# Patient Record
Sex: Female | Born: 1960 | Race: Black or African American | Hispanic: No | Marital: Single | Smoking: Former smoker
Health system: Southern US, Community
[De-identification: ages and names within clinical notes are randomized; demographics above are authoritative.]

## PROBLEM LIST (undated history)

## (undated) DIAGNOSIS — I10 Essential (primary) hypertension: Secondary | ICD-10-CM

## (undated) DIAGNOSIS — F17210 Nicotine dependence, cigarettes, uncomplicated: Secondary | ICD-10-CM

## (undated) HISTORY — DX: Nicotine dependence, cigarettes, uncomplicated: F17.210

## (undated) HISTORY — DX: Essential (primary) hypertension: I10

---

## 1997-07-13 ENCOUNTER — Other Ambulatory Visit: Admission: RE | Admit: 1997-07-13 | Discharge: 1997-07-13 | Payer: Self-pay | Admitting: Family Medicine

## 2001-10-07 ENCOUNTER — Other Ambulatory Visit: Admission: RE | Admit: 2001-10-07 | Discharge: 2001-10-07 | Payer: Self-pay | Admitting: Family Medicine

## 2009-11-17 ENCOUNTER — Emergency Department (HOSPITAL_COMMUNITY): Admission: EM | Admit: 2009-11-17 | Discharge: 2009-11-17 | Payer: Self-pay | Admitting: Diagnostic Radiology

## 2010-04-19 LAB — CBC
HCT: 35.9 % — ABNORMAL LOW (ref 36.0–46.0)
Hemoglobin: 12.2 g/dL (ref 12.0–15.0)
MCH: 26.9 pg (ref 26.0–34.0)
MCHC: 34 g/dL (ref 30.0–36.0)
MCV: 79.2 fL (ref 78.0–100.0)
Platelets: 327 10*3/uL (ref 150–400)
RBC: 4.53 MIL/uL (ref 3.87–5.11)
RDW: 13.8 % (ref 11.5–15.5)
WBC: 7 10*3/uL (ref 4.0–10.5)

## 2010-04-19 LAB — DIFFERENTIAL
Basophils Absolute: 0 10*3/uL (ref 0.0–0.1)
Basophils Relative: 0 % (ref 0–1)
Eosinophils Absolute: 0.2 10*3/uL (ref 0.0–0.7)
Eosinophils Relative: 2 % (ref 0–5)
Lymphocytes Relative: 38 % (ref 12–46)
Lymphs Abs: 2.6 10*3/uL (ref 0.7–4.0)
Monocytes Absolute: 0.9 10*3/uL (ref 0.1–1.0)
Monocytes Relative: 13 % — ABNORMAL HIGH (ref 3–12)
Neutro Abs: 3.3 10*3/uL (ref 1.7–7.7)
Neutrophils Relative %: 47 % (ref 43–77)

## 2010-04-19 LAB — POCT CARDIAC MARKERS
CKMB, poc: <1 ng/mL — ABNORMAL LOW (ref 1.0–8.0)
Myoglobin, poc: 25.2 ng/mL (ref 12–200)
Troponin i, poc: <0.05 ng/mL (ref 0.00–0.09)

## 2010-04-19 LAB — TSH: TSH: 3.27 u[IU]/mL (ref 0.350–4.500)

## 2010-04-19 LAB — POCT I-STAT, CHEM 8
BUN: 15 mg/dL (ref 6–23)
Calcium, Ion: 1.06 mmol/L — ABNORMAL LOW (ref 1.12–1.32)
Chloride: 98 mEq/L (ref 96–112)
Creatinine, Ser: 1.3 mg/dL — ABNORMAL HIGH (ref 0.4–1.2)
Glucose, Bld: 128 mg/dL — ABNORMAL HIGH (ref 70–99)
HCT: 39 % (ref 36.0–46.0)
Hemoglobin: 13.3 g/dL (ref 12.0–15.0)
Potassium: 2.6 mEq/L — CL (ref 3.5–5.1)
Sodium: 138 mEq/L (ref 135–145)
TCO2: 27 mmol/L (ref 0–100)

## 2011-09-28 IMAGING — CR DG CHEST 2V
2 series · 2 of 2 positions shown · non-contrast
Comparison: None.

CLINICAL DATA: Tachycardia.  Palpitations.

CHEST - 2 VIEW

[w chest pa]
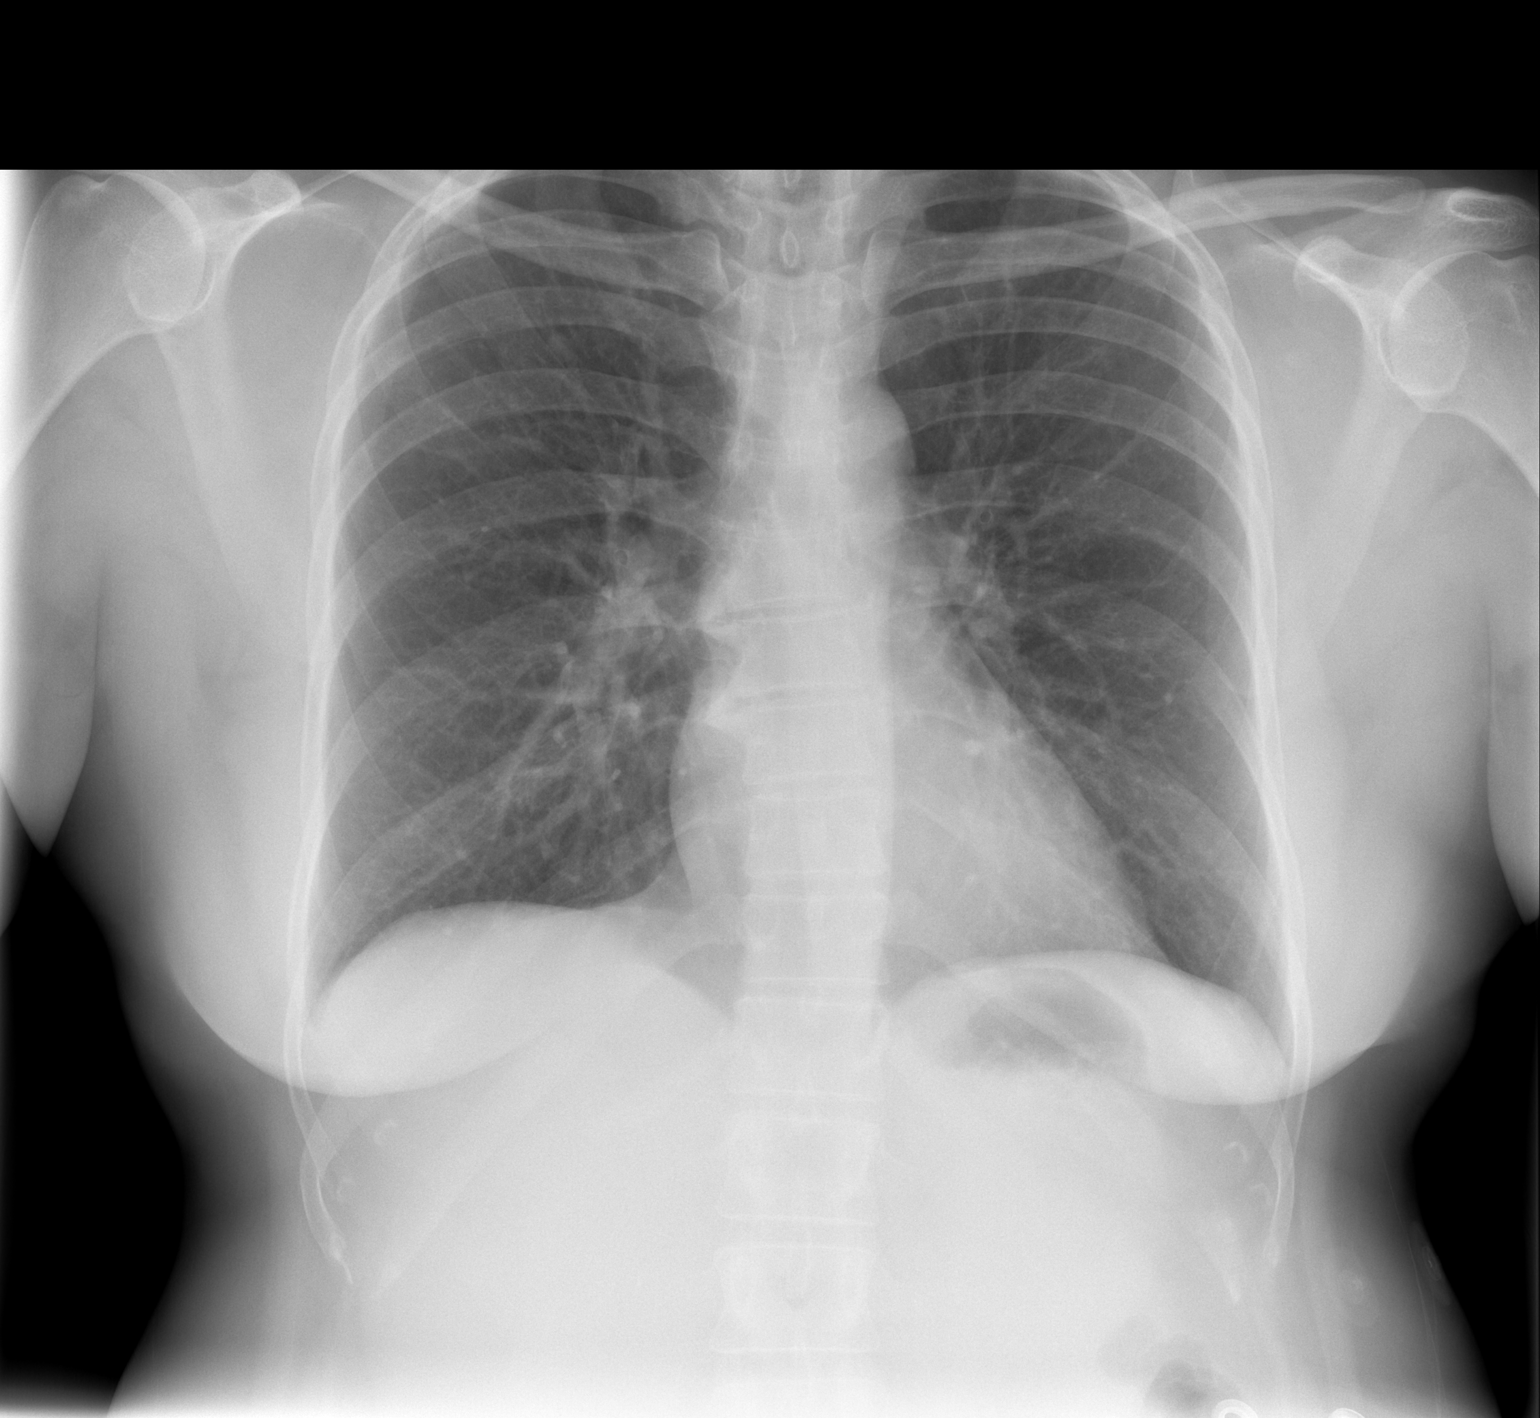

[w chest lat]
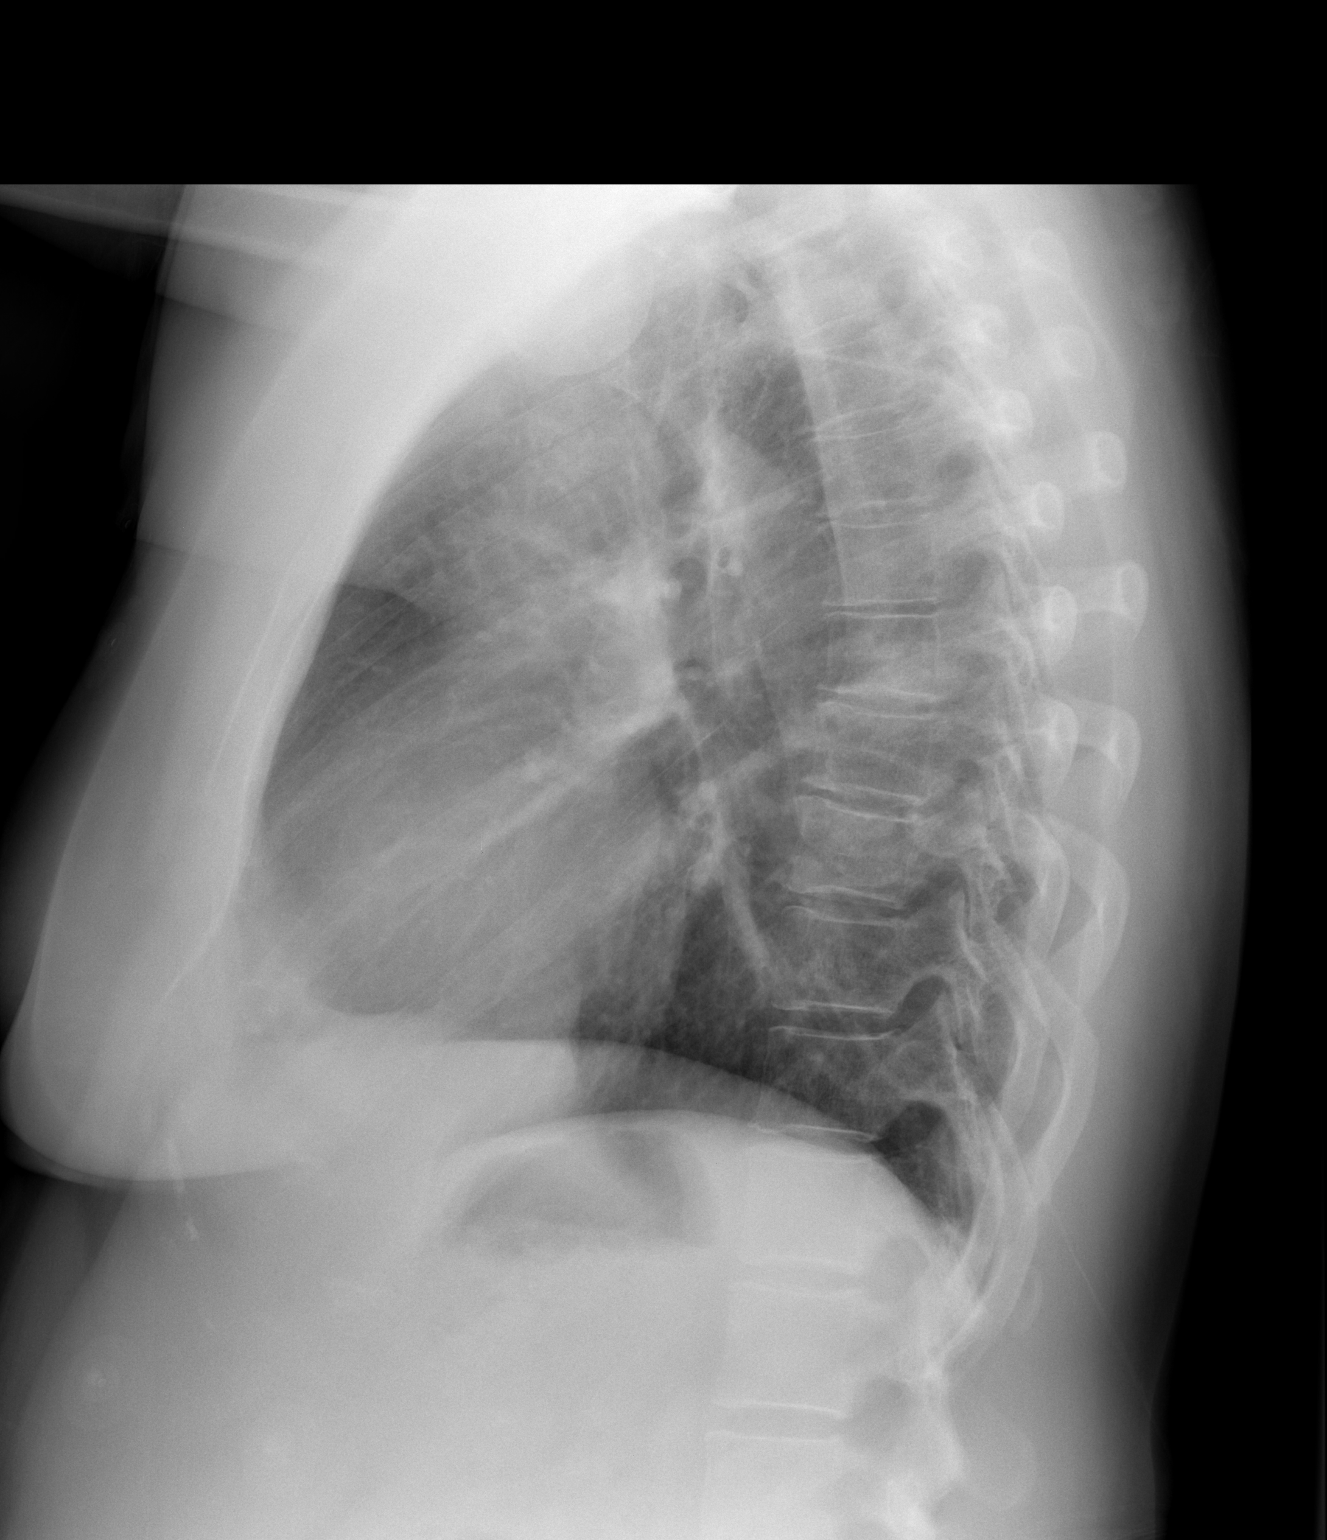

[2 of 2 positions shown; findings below may reference images not displayed]

FINDINGS: The heart size is normal.  The lungs are clear.  The the
visualized soft tissues and bony thorax are unremarkable.
IMPRESSION: Negative chest.

## 2017-05-05 ENCOUNTER — Ambulatory Visit (INDEPENDENT_AMBULATORY_CARE_PROVIDER_SITE_OTHER): Payer: Self-pay | Admitting: Family Medicine

## 2017-05-05 ENCOUNTER — Encounter: Payer: Self-pay | Admitting: Family Medicine

## 2017-05-05 VITALS — BP 144/70 | HR 76 | Temp 97.9°F | Resp 16 | Ht 64.0 in | Wt 188.0 lb

## 2017-05-05 DIAGNOSIS — Z131 Encounter for screening for diabetes mellitus: Secondary | ICD-10-CM

## 2017-05-05 DIAGNOSIS — E785 Hyperlipidemia, unspecified: Secondary | ICD-10-CM

## 2017-05-05 DIAGNOSIS — H6123 Impacted cerumen, bilateral: Secondary | ICD-10-CM

## 2017-05-05 DIAGNOSIS — I10 Essential (primary) hypertension: Secondary | ICD-10-CM

## 2017-05-05 DIAGNOSIS — Z23 Encounter for immunization: Secondary | ICD-10-CM

## 2017-05-05 DIAGNOSIS — Z9109 Other allergy status, other than to drugs and biological substances: Secondary | ICD-10-CM

## 2017-05-05 DIAGNOSIS — Z114 Encounter for screening for human immunodeficiency virus [HIV]: Secondary | ICD-10-CM

## 2017-05-05 DIAGNOSIS — Z1159 Encounter for screening for other viral diseases: Secondary | ICD-10-CM | POA: Insufficient documentation

## 2017-05-05 LAB — POCT URINALYSIS DIP (DEVICE)
Bilirubin Urine: NEGATIVE
Glucose, UA: NEGATIVE mg/dL
Hgb urine dipstick: NEGATIVE
Ketones, ur: NEGATIVE mg/dL
Leukocytes, UA: NEGATIVE
Nitrite: NEGATIVE
Protein, ur: NEGATIVE mg/dL
Specific Gravity, Urine: 1.02 (ref 1.005–1.030)
Urobilinogen, UA: 0.2 mg/dL (ref 0.0–1.0)
pH: 6 (ref 5.0–8.0)

## 2017-05-05 LAB — POCT GLYCOSYLATED HEMOGLOBIN (HGB A1C): Hemoglobin A1C: 5.5

## 2017-05-05 MED ORDER — HYDROCHLOROTHIAZIDE 25 MG PO TABS: 25.0000 mg | ORAL_TABLET | Freq: Every day | ORAL | 5 refills | Status: DC

## 2017-05-05 NOTE — Patient Instructions (Addendum)
Brenda Francis, BCCCP, for mammogram scheduling. [phone]   Brenda Francis [phone]) to schedule appointment with financial counselor for assistance.    Your blood pressure is above goal, will continue Hydrochlorothiazide 25 mg daily for high blood pressure.  We have discussed target BP range and blood pressure goal. I have advised patient to check BP regularly and to call us back or report to clinic if your blood pressure is  consistently higher than 140/90. We discussed the importance of compliance with medical therapy and DASH diet recommended, consequences of uncontrolled hypertension discussed.  continue current BP medication   Will follow up by phone with any abnormal laboratory results   DASH Eating Plan DASH stands for "Dietary Approaches to Stop Hypertension." The DASH eating plan is a healthy eating plan that has been shown to reduce high blood pressure (hypertension). It may also reduce your risk for type 2 diabetes, heart disease, and stroke. The DASH eating plan may also help with weight loss. What are tips for following this plan? General guidelines  Avoid eating more than 2,300 mg (milligrams) of salt (sodium) a day. If you have hypertension, you may need to reduce your sodium intake to 1,500 mg a day.  Limit alcohol intake to no more than 1 drink a day for nonpregnant women and 2 drinks a day for men. One drink equals 12 oz of beer, 5 oz of wine, or 1 oz of hard liquor.  Work with your health care provider to maintain a healthy body weight or to lose weight. Ask what an ideal weight is for you.  Get at least 30 minutes of exercise that causes your heart to beat faster (aerobic exercise) most days of the week. Activities may include walking, swimming, or biking.  Work with your health care provider or diet and nutrition specialist (dietitian) to adjust your eating plan to your individual calorie needs. Reading food labels  Check food labels for the  amount of sodium per serving. Choose foods with less than 5 percent of the Daily Value of sodium. Generally, foods with less than 300 mg of sodium per serving fit into this eating plan.  To find whole grains, look for the word "whole" as the first word in the ingredient list. Shopping  Buy products labeled as "low-sodium" or "no salt added."  Buy fresh foods. Avoid canned foods and premade or frozen meals. Cooking  Avoid adding salt when cooking. Use salt-free seasonings or herbs instead of table salt or sea salt. Check with your health care provider or pharmacist before using salt substitutes.  Do not fry foods. Cook foods using healthy methods such as baking, boiling, grilling, and broiling instead.  Cook with heart-healthy oils, such as olive, canola, soybean, or sunflower oil. Meal planning   Eat a balanced diet that includes: ? 5 or more servings of fruits and vegetables each day. At each meal, try to fill half of your plate with fruits and vegetables. ? Up to 6-8 servings of whole grains each day. ? Less than 6 oz of lean meat, poultry, or fish each day. A 3-oz serving of meat is about the same size as a deck of cards. One egg equals 1 oz. ? 2 servings of low-fat dairy each day. ? A serving of nuts, seeds, or beans 5 times each week. ? Heart-healthy fats. Healthy fats called Omega-3 fatty acids are found in foods such as flaxseeds and coldwater fish, like sardines, salmon, and mackerel.  Limit how much you eat  of the following: ? Canned or prepackaged foods. ? Food that is high in trans fat, such as fried foods. ? Food that is high in saturated fat, such as fatty meat. ? Sweets, desserts, sugary drinks, and other foods with added sugar. ? Full-fat dairy products.  Do not salt foods before eating.  Try to eat at least 2 vegetarian meals each week.  Eat more home-cooked food and less restaurant, buffet, and fast food.  When eating at a restaurant, ask that your food be  prepared with less salt or no salt, if possible. What foods are recommended? The items listed may not be a complete list. Talk with your dietitian about what dietary choices are best for you. Grains Whole-grain or whole-wheat bread. Whole-grain or whole-wheat pasta. Brown rice. Orpah Cobbatmeal. Quinoa. Bulgur. Whole-grain and low-sodium cereals. Pita bread. Low-fat, low-sodium crackers. Whole-wheat flour tortillas. Vegetables Fresh or frozen vegetables (raw, steamed, roasted, or grilled). Low-sodium or reduced-sodium tomato and vegetable juice. Low-sodium or reduced-sodium tomato sauce and tomato paste. Low-sodium or reduced-sodium canned vegetables. Fruits All fresh, dried, or frozen fruit. Canned fruit in natural juice (without added sugar). Meat and other protein foods Skinless chicken or Malawiturkey. Ground chicken or Mal[place]iturkey. Pork with fat trimmed off. Fish and seafood. Egg whites. Dried beans, peas, or lentils. Unsalted nuts, nut butters, and seeds. Unsalted canned beans. Lean cuts of beef with fat trimmed off. Low-sodium, lean deli meat. Dairy Low-fat (1%) or fat-free (skim) milk. Fat-free, low-fat, or reduced-fat cheeses. Nonfat, low-sodium ricotta or cottage cheese. Low-fat or nonfat yogurt. Low-fat, low-sodium cheese. Fats and oils Soft margarine without trans fats. Vegetable oil. Low-fat, reduced-fat, or light mayonnaise and salad dressings (reduced-sodium). Canola, safflower, olive, soybean, and sunflower oils. Avocado. Seasoning and other foods Herbs. Spices. Seasoning mixes without salt. Unsalted popcorn and pretzels. Fat-free sweets. What foods are not recommended? The items listed may not be a complete list. Talk with your dietitian about what dietary choices are best for you. Grains Baked goods made with fat, such as croissants, muffins, or some breads. Dry pasta or rice meal packs. Vegetables Creamed or fried vegetables. Vegetables in a cheese sauce. Regular canned vegetables (not  low-sodium or reduced-sodium). Regular canned tomato sauce and paste (not low-sodium or reduced-sodium). Regular tomato and vegetable juice (not low-sodium or reduced-sodium). Rosita FirePickles. Olives. Fruits Canned fruit in a light or heavy syrup. Fried fruit. Fruit in cream or butter sauce. Meat and other protein foods Fatty cuts of meat. Ribs. Fried meat. Tomasa BlaseBacon. Sausage. Bologna and other processed lunch meats. Salami. Fatback. Hotdogs. Bratwurst. Salted nuts and seeds. Canned beans with added salt. Canned or smoked fish. Whole eggs or egg yolks. Chicken or Malawiturkey with skin. Dairy Whole or 2% milk, cream, and half-and-half. Whole or full-fat cream cheese. Whole-fat or sweetened yogurt. Full-fat cheese. Nondairy creamers. Whipped toppings. Processed cheese and cheese spreads. Fats and oils Butter. Stick margarine. Lard. Shortening. Ghee. Bacon fat. Tropical oils, such as coconut, palm kernel, or palm oil. Seasoning and other foods Salted popcorn and pretzels. Onion salt, garlic salt, seasoned salt, table salt, and sea salt. Worcestershire sauce. Tartar sauce. Barbecue sauce. Teriyaki sauce. Soy sauce, including reduced-sodium. Steak sauce. Canned and packaged gravies. Fish sauce. Oyster sauce. Cocktail sauce. Horseradish that you find on the shelf. Ketchup. Mustard. Meat flavorings and tenderizers. Bouillon cubes. Hot sauce and Tabasco sauce. Premade or packaged marinades. Premade or packaged taco seasonings. Relishes. Regular salad dressings. Where to find more information:  National Heart, Lung, and Blood Institute: PopSteam.iswww.nhlbi.nih.gov  American Heart  Association: www.heart.org Summary  The DASH eating plan is a healthy eating plan that has been shown to reduce high blood pressure (hypertension). It may also reduce your risk for type 2 diabetes, heart disease, and stroke.  With the DASH eating plan, you should limit salt (sodium) intake to 2,300 mg a day. If you have hypertension, you may need to reduce  your sodium intake to 1,500 mg a day.  When on the DASH eating plan, aim to eat more fresh fruits and vegetables, whole grains, lean proteins, low-fat dairy, and heart-healthy fats.  Work with your health care provider or diet and nutrition specialist (dietitian) to adjust your eating plan to your individual calorie needs. This information is not intended to replace advice given to you by your health care provider. Make sure you discuss any questions you have with your health care provider. Document Released: 01/10/2011 Document Revised: 01/15/2016 Document Reviewed: 01/15/2016 Elsevier Interactive Patient Education  [employer].

## 2017-05-05 NOTE — Progress Notes (Signed)
Subjective:    Brenda Francis is a 57 y.o. female with a history of essential hypertension tobacco dependence presents to establish care.  Brenda Francis was a patient of Dr. Billee Cashing prior to establishing here. Patient has been taking hydrochlorothiazide for the past several years. Blood pressure has been at goal. She says that she ran out of medication 6 days ago. She has not been checking blood pressure at home.  She does not follow a low-fat, low-sodium diet and does not exercise routinely.  Patient has a 30-year history of tobacco dependence, she has attempted to quit smoking in the past without success. Patient denies headache, blurred vision, dizziness, paresthesias, chest pain, shortness of breath, heart palpitations, or bilateral lower extremity edema.  Cardiovascular risk factors include: Sedentary lifestyle and tobacco dependence.  Patient also has a family history of heart disease. Past Medical History:  Diagnosis Date  . Essential hypertension   . Tobacco dependence due to cigarettes    Social History   Socioeconomic History  . Marital status: Single    Spouse name: Not on file  . Number of children: Not on file  . Years of education: Not on file  . Highest education level: Not on file  Occupational History  . Not on file  Social Needs  . Financial resource strain: Not on file  . Food insecurity:    Worry: Not on file    Inability: Not on file  . Transportation needs:    Medical: Not on file    Non-medical: Not on file  Tobacco Use  . Smoking status: Current Every Day Smoker    Packs/day: 0.25  . Smokeless tobacco: Never Used  Substance and Sexual Activity  . Alcohol use: Yes    Comment: moderation   . Drug use: Never  . Sexual activity: Not on file  Lifestyle  . Physical activity:    Days per week: Not on file    Minutes per session: Not on file  . Stress: Not on file  Relationships  . Social connections:    Talks on phone: Not on file    Gets together:  Not on file    Attends religious service: Not on file    Active member of club or organization: Not on file    Attends meetings of clubs or organizations: Not on file    Relationship status: Not on file  . Intimate partner violence:    Fear of current or ex partner: Not on file    Emotionally abused: Not on file    Physically abused: Not on file    Forced sexual activity: Not on file  Other Topics Concern  . Not on file  Social History Narrative  . Not on file   Immunization History  Administered Date(s) Administered  . Pneumococcal Polysaccharide-23 05/05/2017   No Known Allergies    Review of Systems Review of Systems  Constitutional: Negative.  Negative for diaphoresis and malaise/fatigue.  HENT: Negative.   Eyes: Negative.   Respiratory: Negative.   Cardiovascular: Positive for palpitations.  Gastrointestinal: Negative.   Genitourinary: Negative.  Negative for dysuria, frequency, hematuria and urgency.  Musculoskeletal: Negative.   Skin: Negative.   Neurological: Negative.   Endo/Heme/Allergies: Negative.   Psychiatric/Behavioral: Negative.    Objective:  BP (!) 144/70 (BP Location: Left Arm, Patient Position: Sitting, Cuff Size: Normal) Comment: mANUALLY  Pulse 76   Temp 97.9 F (36.6 C) (Oral)   Resp 16   Ht 5\' 4"  (1.626 m)  Wt 188 lb (85.3 kg)   SpO2 100%   BMI 32.27 kg/m   General Appearance:    Alert, cooperative, no distress, appears stated age  Head:    Normocephalic, without obvious abnormality, atraumatic  Eyes:    PERRL, conjunctiva/corneas clear, EOM's intact, fundi    benign, both eyes  Ears:   Cerumen impaction, unable to visualize tympanic membrane   Nose:   Nares normal, septum midline, mucosa normal, no drainage    or sinus tenderness  Throat:   Lips, mucosa, and tongue normal; teeth and gums normal  Neck:   Supple, symmetrical, trachea midline, no adenopathy;    thyroid:  no enlargement/tenderness/nodules; no carotid   bruit or JVD   Back:     Symmetric, no curvature, ROM normal, no CVA tenderness  Lungs:     Clear to auscultation bilaterally, respirations unlabored  Chest Wall:    No tenderness or deformity   Heart:    Regular rate and rhythm, S1 and S2 normal, no murmur, rub   or gallop  Abdomen:     Soft, non-tender, bowel sounds active all four quadrants,    no masses, no organomegaly  Extremities:   Extremities normal, atraumatic, no cyanosis or edema  Pulses:   2+ and symmetric all extremities  Skin:   Skin color, texture, turgor normal, no rashes or lesions  Lymph nodes:   Cervical, supraclavicular, and axillary nodes normal  Neurologic:   CNII-XII intact, normal strength, sensation and reflexes    throughout    Cardiographics ECG: abnormal, reviewed tracing  Assessment:   BP (!) 144/70 (BP Location: Left Arm, Patient Position: Sitting, Cuff Size: Normal) Comment: mANUALLY  Pulse 76   Temp 97.9 F (36.6 C) (Oral)   Resp 16   Ht 5\' 4"  (1.626 m)   Wt 188 lb (85.3 kg)   SpO2 100%   BMI 32.27 kg/m  Plan:  1. Essential hypertension Blood pressure is above goal.  Reminded of the importance of taking medications consistently in order to achieve positive outcomes.  Will restart thiazide diuretic.  Patient to return in 2 weeks for blood pressure check.  Will review renal functioning as results become available.  Reviewed EKG, abnormal tracing.  Will repeat in 2 months, hopefully will improve with blood pressure control.  Advised to start a low-sodium, low-fat diet low impact cardiovascular exercise routine. - POCT urinalysis dip (device) - Comprehensive metabolic panel - EKG 12-Lead - hydrochlorothiazide (HYDRODIURIL) 25 MG tablet; Take 1 tablet (25 mg total) by mouth daily.  Dispense: 30 tablet; Refill: 5  2. Hyperlipidemia LDL goal <70 Patient is not currently on statin or ASA therapy.  She endorses a history of hyperlipidemia. - Lipid Panel  3. Immunization due - Pneumococcal polysaccharide vaccine  23-valent greater than or equal to 2yo subcutaneous/IM  4. Need for hepatitis C screening test - Hepatitis C Antibody  5. Diabetes mellitus screening - HgB A1c  6. Screening for HIV (human immunodeficiency virus) - HIV antibody (with reflex)  7. Bilateral impacted cerumen Tympanic membranes visualized following bilateral ear lavage. - Ear Lavage  8. Environmental allergies Continue cetirizine 10 mg as previously prescribed.  Avoid scratch that patient advised to avoid outdoor activities during periods of high pollen index. - cetirizine (ZYRTEC) 10 MG tablet; Take 10 mg by mouth daily.  RTC: Return in 2 weeks for blood pressure check in 3 months for hypertension  Brenda NationsLachina Moore Sherina Stammer  MSN, FNP-C [facility] [address]  [place], [place] [address] [phone]

## 2017-05-06 LAB — COMPREHENSIVE METABOLIC PANEL
ALT: 62 IU/L — ABNORMAL HIGH (ref 0–32)
AST: 45 IU/L — ABNORMAL HIGH (ref 0–40)
Albumin/Globulin Ratio: 1.3 (ref 1.2–2.2)
Albumin: 4.3 g/dL (ref 3.5–5.5)
Alkaline Phosphatase: 68 IU/L (ref 39–117)
BUN/Creatinine Ratio: 19 (ref 9–23)
BUN: 17 mg/dL (ref 6–24)
Bilirubin Total: 0.3 mg/dL (ref 0.0–1.2)
CO2: 23 mmol/L (ref 20–29)
Calcium: 10.1 mg/dL (ref 8.7–10.2)
Chloride: 104 mmol/L (ref 96–106)
Creatinine, Ser: 0.88 mg/dL (ref 0.57–1.00)
GFR calc Af Amer: 86 mL/min/{1.73_m2} (ref >59)
GFR calc non Af Amer: 75 mL/min/{1.73_m2} (ref >59)
Globulin, Total: 3.4 g/dL (ref 1.5–4.5)
Glucose: 89 mg/dL (ref 65–99)
Potassium: 4.4 mmol/L (ref 3.5–5.2)
Sodium: 142 mmol/L (ref 134–144)
Total Protein: 7.7 g/dL (ref 6.0–8.5)

## 2017-05-06 LAB — LIPID PANEL
Chol/HDL Ratio: 3.6 ratio (ref 0.0–4.4)
Cholesterol, Total: 256 mg/dL — ABNORMAL HIGH (ref 100–199)
HDL: 72 mg/dL (ref >39)
LDL Calculated: 159 mg/dL — ABNORMAL HIGH (ref 0–99)
Triglycerides: 127 mg/dL (ref 0–149)
VLDL Cholesterol Cal: 25 mg/dL (ref 5–40)

## 2017-05-06 LAB — HEPATITIS C ANTIBODY: Hep C Virus Ab: <0.1 s/co ratio (ref 0.0–0.9)

## 2017-05-06 LAB — HIV ANTIBODY (ROUTINE TESTING W REFLEX): HIV Screen 4th Generation wRfx: NONREACTIVE

## 2017-05-07 ENCOUNTER — Other Ambulatory Visit: Payer: Self-pay | Admitting: Family Medicine

## 2017-05-07 DIAGNOSIS — E785 Hyperlipidemia, unspecified: Secondary | ICD-10-CM

## 2017-05-07 MED ORDER — ATORVASTATIN CALCIUM 20 MG PO TABS: 20.0000 mg | ORAL_TABLET | Freq: Every day | ORAL | 3 refills | Status: DC

## 2017-05-07 MED ORDER — ASPIRIN EC 81 MG PO TBEC: 81.0000 mg | DELAYED_RELEASE_TABLET | Freq: Every day | ORAL | 11 refills | Status: DC

## 2017-05-07 NOTE — Progress Notes (Signed)
The 10-year ASCVD risk score Denman George(Goff DC Montez HagemanJr., et al., 2013) is: 12.7%   Values used to calculate the score:     Age: [age] years     Sex: Female     Is Non-Hispanic African American: Yes     Diabetic: No     Tobacco smoker: Yes     Systolic Blood Pressure: 144 mmHg     Is BP treated: Yes     HDL Cholesterol: 72 mg/dL     Total Cholesterol: 256 mg/dL Meds ordered this encounter  Medications  . atorvastatin (LIPITOR) 20 MG tablet    Sig: Take 1 tablet (20 mg total) by mouth daily.    Dispense:  90 tablet    Refill:  3  . aspirin EC 81 MG tablet    Sig: Take 1 tablet (81 mg total) by mouth daily.    Dispense:  30 tablet    Refill:  11    Nolon NationsLachina Moore Merrilee Ancona  MSN, FNP-C Patient [facility] [address]  [place], [place] [id number] [phone]

## 2017-05-08 ENCOUNTER — Telehealth: Payer: Self-pay

## 2017-05-08 NOTE — Telephone Encounter (Signed)
Called, no answer. Left a message for patient to call back and left call back number. Thanks!  

## 2017-05-08 NOTE — Telephone Encounter (Signed)
-----   Message from Massie MaroonLachina M Hollis, [place]NP sent at 05/07/2017  4:27 PM EDT ----- Regarding: lab results Please inform patient that cholesterol is elevated at 256, goal is less than 200.  Also, LDL cholesterol is 159, goal is less than 70.  Will start atorvastatin 20 mg daily with dinner and aspirin 81 mg daily for stroke prevention.  Also recommend a low-fat, low-sodium diet divided over 5-6 small meals throughout the day.  Lastly, I recommend a low impact cardiovascular exercise routine 3-5 days/week. Please have her follow-up in clinic as previously scheduled.  Nolon NationsLachina Moore Hollis  MSN, FNP-C Patient Care [facility] [address]  , [place] [id number] [phone]

## 2017-08-11 ENCOUNTER — Ambulatory Visit: Payer: Self-pay | Admitting: Family Medicine

## 2017-08-14 ENCOUNTER — Encounter: Payer: Self-pay | Admitting: Family Medicine

## 2017-08-14 ENCOUNTER — Ambulatory Visit (INDEPENDENT_AMBULATORY_CARE_PROVIDER_SITE_OTHER): Payer: Self-pay | Admitting: Family Medicine

## 2017-08-14 VITALS — BP 127/67 | HR 72 | Temp 97.9°F | Resp 16 | Ht 64.0 in | Wt 188.0 lb

## 2017-08-14 DIAGNOSIS — Z9109 Other allergy status, other than to drugs and biological substances: Secondary | ICD-10-CM

## 2017-08-14 DIAGNOSIS — I10 Essential (primary) hypertension: Secondary | ICD-10-CM

## 2017-08-14 DIAGNOSIS — L309 Dermatitis, unspecified: Secondary | ICD-10-CM

## 2017-08-14 DIAGNOSIS — E785 Hyperlipidemia, unspecified: Secondary | ICD-10-CM

## 2017-08-14 LAB — POCT URINALYSIS DIPSTICK
Bilirubin, UA: NEGATIVE
Blood, UA: NEGATIVE
Glucose, UA: NEGATIVE
Ketones, UA: NEGATIVE
Leukocytes, UA: NEGATIVE
Nitrite, UA: NEGATIVE
Protein, UA: NEGATIVE
Spec Grav, UA: 1.02 (ref 1.010–1.025)
Urobilinogen, UA: 0.2 E.U./dL
pH, UA: 6 (ref 5.0–8.0)

## 2017-08-14 MED ORDER — HYDROCHLOROTHIAZIDE 25 MG PO TABS: 25.0000 mg | ORAL_TABLET | Freq: Every day | ORAL | 5 refills | Status: DC

## 2017-08-14 MED ORDER — CETIRIZINE HCL 10 MG PO TABS: 10.0000 mg | ORAL_TABLET | Freq: Every day | ORAL | 5 refills | Status: DC

## 2017-08-14 MED ORDER — TRIAMCINOLONE ACETONIDE 0.5 % EX OINT
1.0000 | TOPICAL_OINTMENT | Freq: Two times a day (BID) | CUTANEOUS | 0 refills | Status: DC
Start: 2017-08-14 — End: 2017-09-07

## 2017-08-14 MED ORDER — ASPIRIN EC 81 MG PO TBEC: 81.0000 mg | DELAYED_RELEASE_TABLET | Freq: Every day | ORAL | 11 refills | Status: DC

## 2017-08-14 MED ORDER — ATORVASTATIN CALCIUM 20 MG PO TABS: 20.0000 mg | ORAL_TABLET | Freq: Every day | ORAL | 3 refills | Status: DC

## 2017-08-14 NOTE — Progress Notes (Signed)
  Subjective:    Patient here for follow-up of elevated blood pressure.  She is not exercising and is not adherent to a low-salt diet.  Blood pressure is well controlled at home. Cardiac symptoms: none. Patient denies: chest pain, chest pressure/discomfort, dyspnea, irregular heart beat, lower extremity edema and palpitations. Cardiovascular risk factors: hypertension, obesity (BMI >= 30 kg/m2) and sedentary lifestyle. Use of agents associated with hypertension: none. History of target organ damage: none.  The following portions of the patient's history were reviewed and updated as appropriate: allergies, current medications, past family history, past medical history, past social history, past surgical history and problem list.  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  Neurological: negative Behavioral/Psych: negative     Objective:    BP 127/67 (BP Location: Left Arm, Patient Position: Sitting, Cuff Size: Normal)   Pulse 72   Temp 97.9 F (36.6 C) (Oral)   Resp 16   Ht 5\' 4"  (1.626 m)   Wt 188 lb (85.3 kg)   BMI 32.27 kg/m  General appearance: alert, cooperative, appears stated age and no distress Head: Normocephalic, without obvious abnormality, atraumatic Neck: no adenopathy, no carotid bruit, no JVD, supple, symmetrical, trachea midline and thyroid not enlarged, symmetric, no tenderness/mass/nodules Lungs: clear to auscultation bilaterally Heart: regular rate and rhythm, S1, S2 normal, no murmur, click, rub or gallop Abdomen: soft, non-tender; bowel sounds normal; no masses,  no organomegaly Extremities: extremities normal, atraumatic, no cyanosis or edema Skin: Skin color, texture, turgor normal. No rashes or lesions or eczema - hand(s) bilateral Neurologic: Alert and oriented X 3, normal strength and tone. Normal symmetric reflexes. Normal  coordination and gait    Assessment:    Hypertension, normal blood pressure. Evidence of target organ damage: none.    Plan:    Medication: no change. Regular aerobic exercise. Follow up: 3 months and as needed.   1. Essential hypertension Continue with current medications - Urinalysis Dipstick - CBC with Differential - Comprehensive metabolic panel - TSH - Lipid Panel - Microalbumin/Creatinine Ratio, Urine - hydrochlorothiazide (HYDRODIURIL) 25 MG tablet; Take 1 tablet (25 mg total) by mouth daily.  Dispense: 30 tablet; Refill: 5  2. Hyperlipidemia LDL goal <70 Continue with current medications - CBC with Differential - Comprehensive metabolic panel - TSH - Lipid Panel - Microalbumin/Creatinine Ratio, Urine - atorvastatin (LIPITOR) 20 MG tablet; Take 1 tablet (20 mg total) by mouth daily.  Dispense: 90 tablet; Refill: 3 - aspirin EC 81 MG tablet; Take 1 tablet (81 mg total) by mouth daily.  Dispense: 30 tablet; Refill: 11  3. Environmental allergies Continue with current medications - cetirizine (ZYRTEC) 10 MG tablet; Take 1 tablet (10 mg total) by mouth daily.  Dispense: 30 tablet; Refill: 5 - CBC with Differential - Comprehensive metabolic panel - TSH - Lipid Panel - Microalbumin/Creatinine Ratio, Urine  4. Eczema, unspecified type Start triamcinolone ointment BID x 14 days.

## 2017-08-14 NOTE — Patient Instructions (Signed)

## 2017-08-15 LAB — COMPREHENSIVE METABOLIC PANEL
ALT: 25 IU/L (ref 0–32)
AST: 18 IU/L (ref 0–40)
Albumin/Globulin Ratio: 1.3 (ref 1.2–2.2)
Albumin: 3.9 g/dL (ref 3.5–5.5)
Alkaline Phosphatase: 70 IU/L (ref 39–117)
BUN/Creatinine Ratio: 14 (ref 9–23)
BUN: 12 mg/dL (ref 6–24)
Bilirubin Total: 0.3 mg/dL (ref 0.0–1.2)
CO2: 25 mmol/L (ref 20–29)
Calcium: 9.7 mg/dL (ref 8.7–10.2)
Chloride: 101 mmol/L (ref 96–106)
Creatinine, Ser: 0.88 mg/dL (ref 0.57–1.00)
GFR calc Af Amer: 86 mL/min/{1.73_m2} (ref >59)
GFR calc non Af Amer: 75 mL/min/{1.73_m2} (ref >59)
Globulin, Total: 2.9 g/dL (ref 1.5–4.5)
Glucose: 124 mg/dL — ABNORMAL HIGH (ref 65–99)
Potassium: 3.7 mmol/L (ref 3.5–5.2)
Sodium: 140 mmol/L (ref 134–144)
Total Protein: 6.8 g/dL (ref 6.0–8.5)

## 2017-08-15 LAB — CBC WITH DIFFERENTIAL/PLATELET
Basophils Absolute: 0 10*3/uL (ref 0.0–0.2)
Basos: 0 %
EOS (ABSOLUTE): 0.1 10*3/uL (ref 0.0–0.4)
Eos: 2 %
Hematocrit: 39.1 % (ref 34.0–46.6)
Hemoglobin: 13 g/dL (ref 11.1–15.9)
Immature Grans (Abs): 0 10*3/uL (ref 0.0–0.1)
Immature Granulocytes: 0 %
Lymphocytes Absolute: 1.3 10*3/uL (ref 0.7–3.1)
Lymphs: 28 %
MCH: 28.6 pg (ref 26.6–33.0)
MCHC: 33.2 g/dL (ref 31.5–35.7)
MCV: 86 fL (ref 79–97)
Monocytes Absolute: 0.6 10*3/uL (ref 0.1–0.9)
Monocytes: 13 %
Neutrophils Absolute: 2.7 10*3/uL (ref 1.4–7.0)
Neutrophils: 57 %
Platelets: 313 10*3/uL (ref 150–450)
RBC: 4.54 x10E6/uL (ref 3.77–5.28)
RDW: 13.3 % (ref 12.3–15.4)
WBC: 4.7 10*3/uL (ref 3.4–10.8)

## 2017-08-15 LAB — MICROALBUMIN / CREATININE URINE RATIO
Creatinine, Urine: 197 mg/dL
Microalb/Creat Ratio: 1.7 mg/g creat (ref 0.0–30.0)
Microalbumin, Urine: 3.3 ug/mL

## 2017-08-15 LAB — LIPID PANEL
Chol/HDL Ratio: 3.4 ratio (ref 0.0–4.4)
Cholesterol, Total: 181 mg/dL (ref 100–199)
HDL: 53 mg/dL (ref >39)
LDL Calculated: 100 mg/dL — ABNORMAL HIGH (ref 0–99)
Triglycerides: 142 mg/dL (ref 0–149)
VLDL Cholesterol Cal: 28 mg/dL (ref 5–40)

## 2017-08-15 LAB — TSH: TSH: 1.75 u[IU]/mL (ref 0.450–4.500)

## 2017-08-21 ENCOUNTER — Telehealth: Payer: Self-pay

## 2017-08-21 DIAGNOSIS — Z9109 Other allergy status, other than to drugs and biological substances: Secondary | ICD-10-CM

## 2017-08-21 MED ORDER — CETIRIZINE HCL 10 MG PO TABS: 10.0000 mg | ORAL_TABLET | Freq: Every day | ORAL | 5 refills | Status: DC

## 2017-08-21 NOTE — Telephone Encounter (Signed)
This has been refilled and sent into pharmacy. Thanks!  

## 2017-09-02 ENCOUNTER — Telehealth: Payer: Self-pay | Admitting: Family Medicine

## 2017-09-02 NOTE — Telephone Encounter (Signed)
Brenda Francis, Patient is asking if you can send in Fluocinonide 0.5% cream like she had before the new one you last prescribed her. If so please send to [facility] on [address]. Thanks!

## 2017-09-02 NOTE — Telephone Encounter (Signed)
Pt states the new cream did not work. Pt states she wants to go back to the one she had before. Pt uses [facility] on [address]. Please call pt to clarify and confirm.

## 2017-09-07 MED ORDER — FLUOCINONIDE-E 0.05 % EX CREA: 1.0000 "application " | TOPICAL_CREAM | Freq: Two times a day (BID) | CUTANEOUS | 0 refills | Status: DC

## 2017-09-07 NOTE — Telephone Encounter (Signed)
Sent!

## 2017-09-17 ENCOUNTER — Other Ambulatory Visit: Payer: Self-pay

## 2017-09-17 MED ORDER — FLUOCINONIDE-E 0.05 % EX CREA: 1.0000 "application " | TOPICAL_CREAM | Freq: Two times a day (BID) | CUTANEOUS | 0 refills | Status: DC

## 2017-10-27 ENCOUNTER — Other Ambulatory Visit: Payer: Self-pay

## 2017-10-27 NOTE — Telephone Encounter (Signed)
Okay to refill? Last here 08/14/2017

## 2017-10-29 MED ORDER — FLUOCINONIDE-E 0.05 % EX CREA: 1.0000 "application " | TOPICAL_CREAM | Freq: Two times a day (BID) | CUTANEOUS | 0 refills | Status: DC

## 2017-11-13 ENCOUNTER — Telehealth: Payer: Self-pay

## 2017-11-13 NOTE — Telephone Encounter (Signed)
Left vm letting patient know that her appointment is tomorrow at 10am and to give [place] a call if she needs to reschedule.

## 2017-11-14 ENCOUNTER — Encounter: Payer: Self-pay | Admitting: Family Medicine

## 2017-11-14 ENCOUNTER — Ambulatory Visit (INDEPENDENT_AMBULATORY_CARE_PROVIDER_SITE_OTHER): Payer: Self-pay | Admitting: Family Medicine

## 2017-11-14 VITALS — BP 136/76 | HR 81 | Temp 97.8°F | Resp 16 | Ht 64.0 in | Wt 196.0 lb

## 2017-11-14 DIAGNOSIS — L309 Dermatitis, unspecified: Secondary | ICD-10-CM

## 2017-11-14 DIAGNOSIS — I10 Essential (primary) hypertension: Secondary | ICD-10-CM

## 2017-11-14 DIAGNOSIS — Z9109 Other allergy status, other than to drugs and biological substances: Secondary | ICD-10-CM

## 2017-11-14 MED ORDER — FLUOCINONIDE 0.05 % EX CREA: 1.0000 "application " | TOPICAL_CREAM | Freq: Two times a day (BID) | CUTANEOUS | 2 refills | Status: DC

## 2017-11-14 MED ORDER — CETIRIZINE HCL 10 MG PO TABS: 10.0000 mg | ORAL_TABLET | Freq: Every day | ORAL | 5 refills | Status: DC

## 2017-11-14 MED ORDER — HYDROCHLOROTHIAZIDE 25 MG PO TABS: 25.0000 mg | ORAL_TABLET | Freq: Every day | ORAL | 5 refills | Status: DC

## 2017-11-14 NOTE — Progress Notes (Signed)
[facility]  Provider: Mike Gip, FNP   Hypertension Follow Up Visit  SUBJECTIVE:  Brenda Francis is a 57 y.o. female who  has a past medical history of Essential hypertension and Tobacco dependence due to cigarettes.    New concerns: Patient states that she would like a refill on cream that she has received in the past for eczema on her wrists.  Current Outpatient Medications  Medication Sig Dispense Refill  . aspirin EC 81 MG tablet Take 1 tablet (81 mg total) by mouth daily. 30 tablet 11  . atorvastatin (LIPITOR) 20 MG tablet Take 1 tablet (20 mg total) by mouth daily. 90 tablet 3  . cetirizine (ZYRTEC) 10 MG tablet Take 1 tablet (10 mg total) by mouth daily. 30 tablet 5  . fluocinonide-emollient (LIDEX-E) 0.05 % cream Apply 1 application topically 2 (two) times daily. 60 g 0  . hydrochlorothiazide (HYDRODIURIL) 25 MG tablet Take 1 tablet (25 mg total) by mouth daily. 30 tablet 5   No current facility-administered medications for this visit.     No results found for this or any previous visit (from the past 2160 hour(s)).  Hypertension ROS: Review of Systems  Constitutional: Negative.   HENT: Negative.   Eyes: Negative.   Respiratory: Negative.   Cardiovascular: Negative.   Gastrointestinal: Negative.   Genitourinary: Negative.   Musculoskeletal: Negative.   Skin: Positive for rash (bilateral wrists. ).  Neurological: Negative.   Psychiatric/Behavioral: Negative.      OBJECTIVE:   BP (!) 147/75 (BP Location: Right Arm, Patient Position: Sitting, Cuff Size: Normal)   Pulse 81   Temp 97.8 F (36.6 C) (Oral)   Resp 16   Ht 5\' 4"  (1.626 m)   Wt 196 lb (88.9 kg)   SpO2 98%   BMI 33.64 kg/m   Physical Exam  Constitutional: She is oriented to person, place, and time. She appears well-developed and well-nourished.  HENT:  Head: Normocephalic and atraumatic.  Right Ear: External ear normal.  Eyes: Pupils are  equal, round, and reactive to light. Conjunctivae and EOM are normal.  Neck: Normal range of motion. Neck supple.  Cardiovascular: Normal rate, regular rhythm and normal heart sounds.  Pulmonary/Chest: Effort normal and breath sounds normal. No respiratory distress.  Musculoskeletal: Normal range of motion.  Neurological: She is alert and oriented to person, place, and time.  Skin: Skin is warm and dry.  Hyperpigmented  Rash noted to the bilateral wrists. The area has a few raised lesions. No signs of infection noted.   Nursing note and vitals reviewed.      ASSESSMENT/PLAN:   1. Essential hypertension The current medical regimen is effective;  continue present plan and medications.   - Urinalysis, Routine w reflex microscopic - hydrochlorothiazide (HYDRODIURIL) 25 MG tablet; Take 1 tablet (25 mg total) by mouth daily.  Dispense: 30 tablet; Refill: 5  2. Environmental allergies - cetirizine (ZYRTEC) 10 MG tablet; Take 1 tablet (10 mg total) by mouth daily.  Dispense: 30 tablet; Refill: 5  3. Eczema, unspecified type - fluocinonide cream (LIDEX) 0.05 %; Apply 1 application topically 2 (two) times daily.  Dispense: 30 g; Refill: 2 Patient to return in 3 months for pap smear.  The patient is asked to make an attempt to improve diet and exercise patterns to aid in medical management of this problem.  Return to care as scheduled and prn. Patient verbalized understanding and agreed with plan of care.  Brenda Francis. Brenda Canary, FNP-BC [facility] [address] [place], [place] [address] [phone]

## 2017-11-14 NOTE — Patient Instructions (Signed)
DASH Eating Plan DASH stands for "Dietary Approaches to Stop Hypertension." The DASH eating plan is a healthy eating plan that has been shown to reduce high blood pressure (hypertension). It may also reduce your risk for type 2 diabetes, heart disease, and stroke. The DASH eating plan may also help with weight loss. What are tips for following this plan? General guidelines  Avoid eating more than 2,300 mg (milligrams) of salt (sodium) a day. If you have hypertension, you may need to reduce your sodium intake to 1,500 mg a day.  Limit alcohol intake to no more than 1 drink a day for nonpregnant women and 2 drinks a day for men. One drink equals 12 oz of beer, 5 oz of wine, or 1 oz of hard liquor.  Work with your health care provider to maintain a healthy body weight or to lose weight. Ask what an ideal weight is for you.  Get at least 30 minutes of exercise that causes your heart to beat faster (aerobic exercise) most days of the week. Activities may include walking, swimming, or biking.  Work with your health care provider or diet and nutrition specialist (dietitian) to adjust your eating plan to your individual calorie needs. Reading food labels  Check food labels for the amount of sodium per serving. Choose foods with less than 5 percent of the Daily Value of sodium. Generally, foods with less than 300 mg of sodium per serving fit into this eating plan.  To find whole grains, look for the word "whole" as the first word in the ingredient list. Shopping  Buy products labeled as "low-sodium" or "no salt added."  Buy fresh foods. Avoid canned foods and premade or frozen meals. Cooking  Avoid adding salt when cooking. Use salt-free seasonings or herbs instead of table salt or sea salt. Check with your health care provider or pharmacist before using salt substitutes.  Do not fry foods. Cook foods using healthy methods such as baking, boiling, grilling, and broiling instead.  Cook with  heart-healthy oils, such as olive, canola, soybean, or sunflower oil. Meal planning   Eat a balanced diet that includes: ? 5 or more servings of fruits and vegetables each day. At each meal, try to fill half of your plate with fruits and vegetables. ? Up to 6-8 servings of whole grains each day. ? Less than 6 oz of lean meat, poultry, or fish each day. A 3-oz serving of meat is about the same size as a deck of cards. One egg equals 1 oz. ? 2 servings of low-fat dairy each day. ? A serving of nuts, seeds, or beans 5 times each week. ? Heart-healthy fats. Healthy fats called Omega-3 fatty acids are found in foods such as flaxseeds and coldwater fish, like sardines, salmon, and mackerel.  Limit how much you eat of the following: ? Canned or prepackaged foods. ? Food that is high in trans fat, such as fried foods. ? Food that is high in saturated fat, such as fatty meat. ? Sweets, desserts, sugary drinks, and other foods with added sugar. ? Full-fat dairy products.  Do not salt foods before eating.  Try to eat at least 2 vegetarian meals each week.  Eat more home-cooked food and less restaurant, buffet, and fast food.  When eating at a restaurant, ask that your food be prepared with less salt or no salt, if possible. What foods are recommended? The items listed may not be a complete list. Talk with your dietitian about what   dietary choices are best for you. Grains Whole-grain or whole-wheat bread. Whole-grain or whole-wheat pasta. Brown rice. Oatmeal. Quinoa. Bulgur. Whole-grain and low-sodium cereals. Pita bread. Low-fat, low-sodium crackers. Whole-wheat flour tortillas. Vegetables Fresh or frozen vegetables (raw, steamed, roasted, or grilled). Low-sodium or reduced-sodium tomato and vegetable juice. Low-sodium or reduced-sodium tomato sauce and tomato paste. Low-sodium or reduced-sodium canned vegetables. Fruits All fresh, dried, or frozen fruit. Canned fruit in natural juice (without  added sugar). Meat and other protein foods Skinless chicken or turkey. Ground chicken or turkey. Pork with fat trimmed off. Fish and seafood. Egg whites. Dried beans, peas, or lentils. Unsalted nuts, nut butters, and seeds. Unsalted canned beans. Lean cuts of beef with fat trimmed off. Low-sodium, lean deli meat. Dairy Low-fat (1%) or fat-free (skim) milk. Fat-free, low-fat, or reduced-fat cheeses. Nonfat, low-sodium ricotta or cottage cheese. Low-fat or nonfat yogurt. Low-fat, low-sodium cheese. Fats and oils Soft margarine without trans fats. Vegetable oil. Low-fat, reduced-fat, or light mayonnaise and salad dressings (reduced-sodium). Canola, safflower, olive, soybean, and sunflower oils. Avocado. Seasoning and other foods Herbs. Spices. Seasoning mixes without salt. Unsalted popcorn and pretzels. Fat-free sweets. What foods are not recommended? The items listed may not be a complete list. Talk with your dietitian about what dietary choices are best for you. Grains Baked goods made with fat, such as croissants, muffins, or some breads. Dry pasta or rice meal packs. Vegetables Creamed or fried vegetables. Vegetables in a cheese sauce. Regular canned vegetables (not low-sodium or reduced-sodium). Regular canned tomato sauce and paste (not low-sodium or reduced-sodium). Regular tomato and vegetable juice (not low-sodium or reduced-sodium). Pickles. Olives. Fruits Canned fruit in a light or heavy syrup. Fried fruit. Fruit in cream or butter sauce. Meat and other protein foods Fatty cuts of meat. Ribs. Fried meat. Bacon. Sausage. Bologna and other processed lunch meats. Salami. Fatback. Hotdogs. Bratwurst. Salted nuts and seeds. Canned beans with added salt. Canned or smoked fish. Whole eggs or egg yolks. Chicken or turkey with skin. Dairy Whole or 2% milk, cream, and half-and-half. Whole or full-fat cream cheese. Whole-fat or sweetened yogurt. Full-fat cheese. Nondairy creamers. Whipped toppings.  Processed cheese and cheese spreads. Fats and oils Butter. Stick margarine. Lard. Shortening. Ghee. Bacon fat. Tropical oils, such as coconut, palm kernel, or palm oil. Seasoning and other foods Salted popcorn and pretzels. Onion salt, garlic salt, seasoned salt, table salt, and sea salt. Worcestershire sauce. Tartar sauce. Barbecue sauce. Teriyaki sauce. Soy sauce, including reduced-sodium. Steak sauce. Canned and packaged gravies. Fish sauce. Oyster sauce. Cocktail sauce. Horseradish that you find on the shelf. Ketchup. Mustard. Meat flavorings and tenderizers. Bouillon cubes. Hot sauce and Tabasco sauce. Premade or packaged marinades. Premade or packaged taco seasonings. Relishes. Regular salad dressings. Where to find more information:  National Heart, Lung, and Blood Institute: www.nhlbi.nih.gov  American Heart Association: www.heart.org Summary  The DASH eating plan is a healthy eating plan that has been shown to reduce high blood pressure (hypertension). It may also reduce your risk for type 2 diabetes, heart disease, and stroke.  With the DASH eating plan, you should limit salt (sodium) intake to 2,300 mg a day. If you have hypertension, you may need to reduce your sodium intake to 1,500 mg a day.  When on the DASH eating plan, aim to eat more fresh fruits and vegetables, whole grains, lean proteins, low-fat dairy, and heart-healthy fats.  Work with your health care provider or diet and nutrition specialist (dietitian) to adjust your eating plan to your individual   calorie needs. This information is not intended to replace advice given to you by your health care provider. Make sure you discuss any questions you have with your health care provider. Document Released: 01/10/2011 Document Revised: 01/15/2016 Document Reviewed: 01/15/2016 Elsevier Interactive Patient Education  2018 Elsevier Inc.  

## 2017-11-15 LAB — URINALYSIS, ROUTINE W REFLEX MICROSCOPIC
Bilirubin, UA: NEGATIVE
Glucose, UA: NEGATIVE
Ketones, UA: NEGATIVE
Leukocytes, UA: NEGATIVE
Nitrite, UA: NEGATIVE
Protein, UA: NEGATIVE
RBC, UA: NEGATIVE
Specific Gravity, UA: 1.017 (ref 1.005–1.030)
Urobilinogen, Ur: 0.2 mg/dL (ref 0.2–1.0)
pH, UA: 5.5 (ref 5.0–7.5)

## 2018-02-16 ENCOUNTER — Ambulatory Visit (INDEPENDENT_AMBULATORY_CARE_PROVIDER_SITE_OTHER): Payer: Self-pay | Admitting: Family Medicine

## 2018-02-16 ENCOUNTER — Encounter: Payer: Self-pay | Admitting: Family Medicine

## 2018-02-16 VITALS — BP 136/72 | HR 74 | Temp 98.0°F | Resp 16 | Ht 64.0 in | Wt 198.0 lb

## 2018-02-16 DIAGNOSIS — Z01419 Encounter for gynecological examination (general) (routine) without abnormal findings: Secondary | ICD-10-CM

## 2018-02-16 DIAGNOSIS — E785 Hyperlipidemia, unspecified: Secondary | ICD-10-CM

## 2018-02-16 DIAGNOSIS — I10 Essential (primary) hypertension: Secondary | ICD-10-CM

## 2018-02-16 DIAGNOSIS — H00014 Hordeolum externum left upper eyelid: Secondary | ICD-10-CM

## 2018-02-16 DIAGNOSIS — Z9109 Other allergy status, other than to drugs and biological substances: Secondary | ICD-10-CM

## 2018-02-16 LAB — POCT URINALYSIS DIPSTICK
Bilirubin, UA: NEGATIVE
Blood, UA: NEGATIVE
Glucose, UA: NEGATIVE
Ketones, UA: NEGATIVE
Leukocytes, UA: NEGATIVE
Nitrite, UA: NEGATIVE
Protein, UA: NEGATIVE
Spec Grav, UA: 1.02 (ref 1.010–1.025)
Urobilinogen, UA: 0.2 E.U./dL
pH, UA: 5.5 (ref 5.0–8.0)

## 2018-02-16 MED ORDER — ERYTHROMYCIN 5 MG/GM OP OINT: 1.0000 "application " | TOPICAL_OINTMENT | Freq: Two times a day (BID) | OPHTHALMIC | 0 refills | Status: AC

## 2018-02-16 MED ORDER — FLUOCINONIDE-E 0.05 % EX CREA: 1.0000 "application " | TOPICAL_CREAM | Freq: Two times a day (BID) | CUTANEOUS | 0 refills | Status: DC

## 2018-02-16 MED ORDER — CETIRIZINE HCL 10 MG PO TABS: 10.0000 mg | ORAL_TABLET | Freq: Every day | ORAL | 5 refills | Status: DC

## 2018-02-16 MED ORDER — HYDROCHLOROTHIAZIDE 25 MG PO TABS: 25.0000 mg | ORAL_TABLET | Freq: Every day | ORAL | 5 refills | Status: DC

## 2018-02-16 MED ORDER — ATORVASTATIN CALCIUM 20 MG PO TABS: 20.0000 mg | ORAL_TABLET | Freq: Every day | ORAL | 3 refills | Status: DC

## 2018-02-16 NOTE — Progress Notes (Signed)
   [facility]  Annual GYN Examination Provider: Mike Gip, FNP   SUBJECTIVE: Yanaira Hoschar is a 58 y.o. female who presents for her annual gynecological examination.  Current concerns: Patient with stye on the upper left eye lid x 1 week.     GYNECOLOGICAL HISTORY: No LMP recorded. Patient is postmenopausal. Contraception: post menopausal status Last Pap: unknown. Results were: unknown Last mammogram: unknown Denies family hx of breast cancer.   OBSTETRIC HISTORY: OB History  No obstetric history on file.     The following portions of the patient's history were reviewed and updated as appropriate: allergies, current medications, past family history, past medical history, past social history, past surgical history and problem list.  REVIEW OF SYSTEMS: Review of Systems  Constitutional: Negative.   HENT: Negative.   Eyes: Positive for redness (upper left lid).  Respiratory: Negative.   Cardiovascular: Negative.   Gastrointestinal: Negative.   Genitourinary: Negative.   Musculoskeletal: Negative.   Skin: Negative.   Neurological: Negative.   Psychiatric/Behavioral: Negative.       OBJECTIVE:  Physical Exam Constitutional:      Appearance: Normal appearance. She is not ill-appearing.  Genitourinary:     Pelvic exam was performed with patient in the lithotomy position.     Vulva, inguinal canal, urethra, bladder, vagina, cervix, right adnexa, left adnexa and rectum normal.     No vaginal discharge.  HENT:     Head: Normocephalic and atraumatic.  Eyes:     General:        Left eye: Hordeolum present.    Extraocular Movements: Extraocular movements intact.     Conjunctiva/sclera: Conjunctivae normal.     Pupils: Pupils are equal, round, and reactive to light.   Neck:     Musculoskeletal: Normal range of motion.  Cardiovascular:     Rate and Rhythm: Normal rate and regular rhythm.  Pulmonary:     Effort:  Pulmonary effort is normal.  Abdominal:     General: Bowel sounds are normal.  Neurological:     Mental Status: She is alert.  Vitals signs and nursing note reviewed. Exam conducted with a chaperone present.      ASSESSMENT/PLAN:  1. Well woman exam with routine gynecological exam No abnormalities noted on exam. Co-testing pap today.  - Pap IG and HPV (high risk) DNA detection  2. Essential hypertension No medication changes - Urinalysis Dipstick  3. Hordeolum externum of left upper eyelid Warm compresses to the affected eye. Medication ordered.  - erythromycin ophthalmic ointment; Place 1 application into the left eye 2 (two) times daily for 7 days.  Dispense: 3.5 g; Refill: 0      Education reviewed: calcium supplements, depression evaluation, low fat, low cholesterol diet, safe sex/STD prevention, self breast exams, skin cancer screening and weight bearing exercise. Mammogram ordered. Follow up in: 3 months.    Return to care as scheduled and prn. Patient verbalized understanding and agreed with plan of care.   Ms. Freda Jackson. Riley Lam, FNP-BC [facility] [address] [place], [place] [address] [phone]

## 2018-02-16 NOTE — Patient Instructions (Signed)

## 2018-02-18 LAB — PAP IG AND HPV HIGH-RISK: HPV, high-risk: NEGATIVE

## 2018-02-19 ENCOUNTER — Encounter: Payer: Self-pay | Admitting: Family Medicine

## 2018-02-19 NOTE — Progress Notes (Signed)
Please print this lab letter and send to Carollee Leitz

## 2018-04-01 ENCOUNTER — Other Ambulatory Visit: Payer: Self-pay | Admitting: Family Medicine

## 2018-04-01 DIAGNOSIS — L309 Dermatitis, unspecified: Secondary | ICD-10-CM

## 2018-04-26 ENCOUNTER — Other Ambulatory Visit: Payer: Self-pay | Admitting: Family Medicine

## 2018-04-26 DIAGNOSIS — L309 Dermatitis, unspecified: Secondary | ICD-10-CM

## 2018-05-18 ENCOUNTER — Ambulatory Visit: Payer: Self-pay | Admitting: Family Medicine

## 2018-05-20 ENCOUNTER — Ambulatory Visit (INDEPENDENT_AMBULATORY_CARE_PROVIDER_SITE_OTHER): Payer: Self-pay | Admitting: Family Medicine

## 2018-05-20 ENCOUNTER — Other Ambulatory Visit: Payer: Self-pay

## 2018-05-20 DIAGNOSIS — I10 Essential (primary) hypertension: Secondary | ICD-10-CM

## 2018-05-20 DIAGNOSIS — M25512 Pain in left shoulder: Secondary | ICD-10-CM

## 2018-05-20 DIAGNOSIS — L309 Dermatitis, unspecified: Secondary | ICD-10-CM

## 2018-05-20 DIAGNOSIS — E785 Hyperlipidemia, unspecified: Secondary | ICD-10-CM

## 2018-05-20 MED ORDER — ATORVASTATIN CALCIUM 20 MG PO TABS: 20.0000 mg | ORAL_TABLET | Freq: Every day | ORAL | 3 refills | Status: DC

## 2018-05-20 MED ORDER — FLUOCINONIDE 0.05 % EX CREA: TOPICAL_CREAM | CUTANEOUS | 0 refills | Status: DC

## 2018-05-20 MED ORDER — HYDROCHLOROTHIAZIDE 25 MG PO TABS: 25.0000 mg | ORAL_TABLET | Freq: Every day | ORAL | 3 refills | Status: DC

## 2018-05-20 MED ORDER — CYCLOBENZAPRINE HCL 10 MG PO TABS: 10.0000 mg | ORAL_TABLET | Freq: Every day | ORAL | 0 refills | Status: DC

## 2018-05-20 MED ORDER — NAPROXEN 500 MG PO TABS: 500.0000 mg | ORAL_TABLET | Freq: Two times a day (BID) | ORAL | 0 refills | Status: AC

## 2018-05-20 MED ORDER — ASPIRIN EC 81 MG PO TBEC: 81.0000 mg | DELAYED_RELEASE_TABLET | Freq: Every day | ORAL | 3 refills | Status: DC

## 2018-05-20 NOTE — Progress Notes (Signed)
  [facility]  Virtual Visit via Telephone Note  I connected with Brenda Francis on 05/20/18 at  9:20 AM EDT by telephone and verified that I am speaking with the correct person using two identifiers.   I discussed the limitations, risks, security and privacy concerns of performing an evaluation and management service by telephone and the availability of in person appointments. I also discussed with the patient that there may be a patient responsible charge related to this service. The patient expressed understanding and agreed to proceed.   History of Present Illness: Patient with a history of HLD and HTN. She is not currenlty following a low sodium or carb modified diet. She states that she is having left shoulder pain that is described as a "toothe ache". She denies injury to the area. Has taken ibuprofen without relief.  Observations/Objective: Patient with regular voice tone, rate and rhythm. Speaking calmly and is in no apparent distress.     Assessment and Plan: Essential hypertension - Plan: hydrochlorothiazide (HYDRODIURIL) 25 MG tablet  Hyperlipidemia LDL goal <70 - Plan: atorvastatin (LIPITOR) 20 MG tablet, aspirin EC 81 MG tablet  Eczema, unspecified type - Plan: fluocinonide cream (LIDEX) 0.05 %  Acute pain of left shoulder - Plan: DG Shoulder Left, naproxen (NAPROSYN) 500 MG tablet, cyclobenzaprine (FLEXERIL) 10 MG tablet     Follow Up Instructions:    I discussed the assessment and treatment plan with the patient. The patient was provided an opportunity to ask questions and all were answered. The patient agreed with the plan and demonstrated an understanding of the instructions.   The patient was advised to call back or seek an in-person evaluation if the symptoms worsen or if the condition fails to improve as anticipated.  I provided 10 minutes of non-face-to-face time during this encounter.  Ms. Andr L. Riley Lam,  FNP-BC [facility] [address] [place], [place] [address] [phone]

## 2018-05-21 ENCOUNTER — Other Ambulatory Visit: Payer: Self-pay

## 2018-05-21 ENCOUNTER — Encounter (HOSPITAL_COMMUNITY): Payer: Self-pay

## 2018-05-21 ENCOUNTER — Emergency Department (HOSPITAL_COMMUNITY)
Admission: EM | Admit: 2018-05-21 | Discharge: 2018-05-21 | Disposition: A | Discharge status: Home / Self Care | Payer: Self-pay | Attending: Emergency Medicine | Admitting: Emergency Medicine

## 2018-05-21 DIAGNOSIS — X58XXXA Exposure to other specified factors, initial encounter: Secondary | ICD-10-CM | POA: Insufficient documentation

## 2018-05-21 DIAGNOSIS — I10 Essential (primary) hypertension: Secondary | ICD-10-CM | POA: Insufficient documentation

## 2018-05-21 DIAGNOSIS — F17200 Nicotine dependence, unspecified, uncomplicated: Secondary | ICD-10-CM | POA: Insufficient documentation

## 2018-05-21 DIAGNOSIS — S46911A Strain of unspecified muscle, fascia and tendon at shoulder and upper arm level, right arm, initial encounter: Secondary | ICD-10-CM | POA: Insufficient documentation

## 2018-05-21 DIAGNOSIS — Y9389 Activity, other specified: Secondary | ICD-10-CM | POA: Insufficient documentation

## 2018-05-21 DIAGNOSIS — Y929 Unspecified place or not applicable: Secondary | ICD-10-CM | POA: Insufficient documentation

## 2018-05-21 DIAGNOSIS — Y998 Other external cause status: Secondary | ICD-10-CM | POA: Insufficient documentation

## 2018-05-21 NOTE — ED Provider Notes (Signed)
[facility]-EMERGENCY DEPT Provider Note   CSN: [id number] Arrival date & time: 05/21/18  1457    History   Chief Complaint Chief Complaint  Patient presents with  . Shoulder Pain    HPI Brenda Francis is a 58 y.o. female.     58 year old female presents with left shoulder pain x2 weeks.  Pain is localized to the top of her shoulder and characterizes sharp and worse with movement.  Does not radiate to her hand.  No reported history of wrist drop.  No trauma noted.  Pain better with remaining still no treatment use prior to arrival.  Called her doctor and she has been prescribed medications which she has not filled yet.     Past Medical History:  Diagnosis Date  . Essential hypertension   . Tobacco dependence due to cigarettes     Patient Active Problem List   Diagnosis Date Noted  . Essential hypertension 05/05/2017  . Need for hepatitis C screening test 05/05/2017  . Hyperlipidemia LDL goal <70 05/05/2017    History reviewed. No pertinent surgical history.   OB History   No obstetric history on file.      Home Medications    Prior to Admission medications   Medication Sig Start Date End Date Taking? Authorizing Provider  aspirin EC 81 MG tablet Take 1 tablet (81 mg total) by mouth daily. 05/20/18   Mike Gipouglas, Andre, FNP  atorvastatin (LIPITOR) 20 MG tablet Take 1 tablet (20 mg total) by mouth daily. 05/20/18   Mike Gipouglas, Andre, FNP  cetirizine (ZYRTEC) 10 MG tablet Take 1 tablet (10 mg total) by mouth daily. 02/16/18 08/15/18  Mike Gipouglas, Andre, FNP  cyclobenzaprine (FLEXERIL) 10 MG tablet Take 1 tablet (10 mg total) by mouth at bedtime. 05/20/18   Mike Gipouglas, Andre, FNP  fluocinonide cream (LIDEX) 0.05 % APPLY 1 APPLICATION OF CREAM EXTERNALLY TWICE DAILY 05/20/18   Mike Gipouglas, Andre, FNP  hydrochlorothiazide (HYDRODIURIL) 25 MG tablet Take 1 tablet (25 mg total) by mouth daily. 05/20/18   Mike Gipouglas, Andre, FNP  naproxen (NAPROSYN) 500 MG tablet Take 1 tablet  (500 mg total) by mouth 2 (two) times daily with a meal for 10 days. 05/20/18 05/30/18  Mike Gipouglas, Andre, FNP    Family History Family History  Problem Relation Age of Onset  . Cancer Mother   . Diabetes Father     Social History Social History   Tobacco Use  . Smoking status: Current Some Day Smoker    Packs/day: 0.25  . Smokeless tobacco: Never Used  Substance Use Topics  . Alcohol use: Yes    Comment: moderation   . Drug use: Never     Allergies   Patient has no known allergies.   Review of Systems Review of Systems  All other systems reviewed and are negative.    Physical Exam Updated Vital Signs BP (!) 156/81 (BP Location: Right Arm)   Pulse 98   Temp 97.7 F (36.5 C) (Oral)   Resp 16   Ht 1.626 m (5\' 4" )   Wt 83.9 kg   SpO2 97%   BMI 31.76 kg/m   Physical Exam Vitals signs and nursing note reviewed.  Constitutional:      Appearance: She is well-developed. She is not toxic-appearing.  HENT:     Head: Normocephalic and atraumatic.  Eyes:     Conjunctiva/sclera: Conjunctivae normal.     Pupils: Pupils are equal, round, and reactive to light.  Neck:     Musculoskeletal: Normal range  of motion.  Cardiovascular:     Rate and Rhythm: Normal rate.  Pulmonary:     Effort: Pulmonary effort is normal.  Musculoskeletal:     Left shoulder: She exhibits decreased range of motion. She exhibits no bony tenderness and no swelling.       Arms:  Skin:    General: Skin is warm and dry.  Neurological:     Mental Status: She is alert and oriented to person, place, and time.      ED Treatments / Results  Labs (all labs ordered are listed, but only abnormal results are displayed) Labs Reviewed - No data to display  EKG None  Radiology No results found.  Procedures Procedures (including critical care time)  Medications Ordered in ED Medications - No data to display   Initial Impression / Assessment and Plan / ED Course  I have reviewed the triage  vital signs and the nursing notes.  Pertinent labs & imaging results that were available during my care of the patient were reviewed by me and considered in my medical decision making (see chart for details).        Patient with likely rotator cuff injury.  Have encouraged patient to get further medications at her doctor has prescribed for her.  Will refer to orthopedics on-call  Final Clinical Impressions(s) / ED Diagnoses   Final diagnoses:  None    ED Discharge Orders    None       Lorre Nick, MD 05/21/18 1521

## 2018-05-21 NOTE — ED Triage Notes (Signed)
Patient c/o left shoulder pain x 2 weeks. Patient denies any obvious injury. Patient states increased pain with movement and unable to raise her left arm up above her head without having increased pain.

## 2018-05-21 NOTE — ED Notes (Signed)
Bed: WTR5 Expected date:  Expected time:  Means of arrival:  Comments: 

## 2018-05-21 NOTE — Discharge Instructions (Addendum)
Take the medications that you have been prescribed.

## 2018-08-19 ENCOUNTER — Other Ambulatory Visit: Payer: Self-pay

## 2018-08-19 ENCOUNTER — Ambulatory Visit (INDEPENDENT_AMBULATORY_CARE_PROVIDER_SITE_OTHER): Payer: Self-pay | Admitting: Family Medicine

## 2018-08-19 ENCOUNTER — Other Ambulatory Visit: Payer: Self-pay | Admitting: Family Medicine

## 2018-08-19 ENCOUNTER — Encounter: Payer: Self-pay | Admitting: Family Medicine

## 2018-08-19 VITALS — BP 135/79 | HR 81 | Temp 97.9°F | Resp 18 | Ht 64.0 in | Wt 195.0 lb

## 2018-08-19 DIAGNOSIS — Z9109 Other allergy status, other than to drugs and biological substances: Secondary | ICD-10-CM

## 2018-08-19 DIAGNOSIS — M25512 Pain in left shoulder: Secondary | ICD-10-CM

## 2018-08-19 DIAGNOSIS — I10 Essential (primary) hypertension: Secondary | ICD-10-CM

## 2018-08-19 DIAGNOSIS — E785 Hyperlipidemia, unspecified: Secondary | ICD-10-CM

## 2018-08-19 LAB — POCT URINALYSIS DIPSTICK
Bilirubin, UA: NEGATIVE
Blood, UA: NEGATIVE
Glucose, UA: NEGATIVE
Ketones, UA: NEGATIVE
Nitrite, UA: NEGATIVE
Protein, UA: NEGATIVE
Spec Grav, UA: 1.02 (ref 1.010–1.025)
Urobilinogen, UA: 1 E.U./dL
pH, UA: 7 (ref 5.0–8.0)

## 2018-08-19 MED ORDER — HYDROCHLOROTHIAZIDE 25 MG PO TABS: 25.0000 mg | ORAL_TABLET | Freq: Every day | ORAL | 3 refills | Status: DC

## 2018-08-19 MED ORDER — TRAMADOL HCL 50 MG PO TABS: 50.0000 mg | ORAL_TABLET | Freq: Four times a day (QID) | ORAL | 0 refills | Status: AC | PRN

## 2018-08-19 MED ORDER — ATORVASTATIN CALCIUM 20 MG PO TABS: 20.0000 mg | ORAL_TABLET | Freq: Every day | ORAL | 3 refills | Status: DC

## 2018-08-19 MED ORDER — CETIRIZINE HCL 10 MG PO TABS: 10.0000 mg | ORAL_TABLET | Freq: Every day | ORAL | 5 refills | Status: DC

## 2018-08-19 NOTE — Progress Notes (Signed)
Patient [facility]   Progress Note: General Provider: Lanae Boast, FNP  SUBJECTIVE:   Brenda Francis is a 58 y.o. female who  has a past medical history of Essential hypertension and Tobacco dependence due to cigarettes.. Patient presents today for Hypertension and Shoulder Pain (left shoulder pain )  Patient reports compliance with all medications.  Patient denies side effects of medications.  She was seen in the ED on 05/21/2018 due to left shoulder pain. She reports not having any relief with flexeril and tylenol. She is requesting imaging of the shoulder. She states that she is having limited ROM and pain.   Review of Systems  Constitutional: Negative.   HENT: Negative.   Eyes: Negative.   Respiratory: Negative.   Cardiovascular: Negative.   Gastrointestinal: Negative.   Genitourinary: Negative.   Musculoskeletal: Positive for joint pain (left shoulder).  Skin: Negative.   Neurological: Negative.   Psychiatric/Behavioral: Negative.      OBJECTIVE: BP 135/79 (BP Location: Right Arm, Patient Position: Sitting, Cuff Size: Normal)   Pulse 81   Temp 97.9 F (36.6 C) (Oral)   Resp 18   Ht 5\' 4"  (1.626 m)   Wt 195 lb (88.5 kg)   BMI 33.47 kg/m   Wt Readings from Last 3 Encounters:  08/19/18 195 lb (88.5 kg)  05/21/18 185 lb (83.9 kg)  02/16/18 198 lb (89.8 kg)     Physical Exam Vitals signs and nursing note reviewed.  Constitutional:      General: She is not in acute distress.    Appearance: Normal appearance.  HENT:     Head: Normocephalic and atraumatic.  Eyes:     Extraocular Movements: Extraocular movements intact.     Conjunctiva/sclera: Conjunctivae normal.     Pupils: Pupils are equal, round, and reactive to light.  Cardiovascular:     Rate and Rhythm: Normal rate and regular rhythm.     Heart sounds: No murmur.  Pulmonary:     Effort: Pulmonary effort is normal.     Breath sounds: Normal breath sounds.   Musculoskeletal:     Left shoulder: She exhibits decreased range of motion, tenderness and pain. She exhibits no swelling, normal pulse and normal strength.  Skin:    General: Skin is warm and dry.  Neurological:     Mental Status: She is alert and oriented to person, place, and time.  Psychiatric:        Mood and Affect: Mood normal.        Behavior: Behavior normal.        Thought Content: Thought content normal.        Judgment: Judgment normal.     ASSESSMENT/PLAN:  1. Essential hypertension - Urinalysis Dipstick - hydrochlorothiazide (HYDRODIURIL) 25 MG tablet; Take 1 tablet (25 mg total) by mouth daily.  Dispense: 90 tablet; Refill: 3 - Comprehensive metabolic panel - Lipid Panel  2. Environmental allergies - cetirizine (ZYRTEC) 10 MG tablet; Take 1 tablet (10 mg total) by mouth daily.  Dispense: 30 tablet; Refill: 5  3. Hyperlipidemia LDL goal <70 - atorvastatin (LIPITOR) 20 MG tablet; Take 1 tablet (20 mg total) by mouth daily.  Dispense: 90 tablet; Refill: 3  4. Acute pain of left shoulder - DG Shoulder Left; Future - traMADol (ULTRAM) 50 MG tablet; Take 1 tablet (50 mg total) by mouth every 6 (six) hours as needed for up to 5 days.  Dispense: 20 tablet; Refill: 0   Return in about  3 months (around 11/19/2018) for htn.    The patient was given clear instructions to go to ER or return to medical center if symptoms do not improve, worsen or new problems develop. The patient verbalized understanding and agreed with plan of care.   Ms. Freda Jacksonndr L. Riley Lamouglas, FNP-BC [facility] [address]  [place], [place] [id number] [phone]

## 2018-08-19 NOTE — Patient Instructions (Signed)
[facility].  Address: [address], [place], [place] [address] Phone: [phone]   Shoulder Pain Many things can cause shoulder pain, including:  An injury.  Moving the shoulder in the same way again and again (overuse).  Joint pain (arthritis). Pain can come from:  Swelling and irritation (inflammation) of any part of the shoulder.  An injury to the shoulder joint.  An injury to: ? Tissues that connect muscle to bone (tendons). ? Tissues that connect bones to each other (ligaments). ? Bones. Follow these instructions at home: Watch for changes in your symptoms. Let your doctor know about them. Follow these instructions to help with your pain. If you have a sling:  Wear the sling as told by your doctor. Remove it only as told by your doctor.  Loosen the sling if your fingers: ? Tingle. ? Become numb. ? Turn cold and blue.  Keep the sling clean.  If the sling is not waterproof: ? Do not let it get wet. ? Take the sling off when you shower or bathe. Managing pain, stiffness, and swelling   If told, put ice on the painful area: ? Put ice in a plastic bag. ? Place a towel between your skin and the bag. ? Leave the ice on for 20 minutes, 2-3 times a day. Stop putting ice on if it does not help with the pain.  Squeeze a soft ball or a foam pad as much as possible. This prevents swelling in the shoulder. It also helps to strengthen the arm. General instructions  Take over-the-counter and prescription medicines only as told by your doctor.  Keep all follow-up visits as told by your doctor. This is important. Contact a doctor if:  Your pain gets worse.  Medicine does not help your pain.  You have new pain in your arm, hand, or fingers. Get help right away if:  Your arm, hand, or fingers: ? Tingle. ? Are numb. ? Are swollen. ? Are painful. ? Turn white or blue. Summary  Shoulder pain can be caused by many things. These include injury,  moving the shoulder in the same away again and again, and joint pain.  Watch for changes in your symptoms. Let your doctor know about them.  This condition may be treated with a sling, ice, and pain medicine.  Contact your doctor if the pain gets worse or you have new pain. Get help right away if your arm, hand, or fingers tingle or get numb, swollen, or painful.  Keep all follow-up visits as told by your doctor. This is important. This information is not intended to replace advice given to you by your health care provider. Make sure you discuss any questions you have with your health care provider. Document Released: 07/10/2007 Document Revised: 08/05/2017 Document Reviewed: 08/05/2017 Elsevier Patient Education  2020 [employer].

## 2018-08-20 LAB — COMPREHENSIVE METABOLIC PANEL
ALT: 35 IU/L — ABNORMAL HIGH (ref 0–32)
AST: 37 IU/L (ref 0–40)
Albumin/Globulin Ratio: 1.4 (ref 1.2–2.2)
Albumin: 4.3 g/dL (ref 3.8–4.9)
Alkaline Phosphatase: 79 IU/L (ref 39–117)
BUN/Creatinine Ratio: 12 (ref 9–23)
BUN: 10 mg/dL (ref 6–24)
Bilirubin Total: 0.4 mg/dL (ref 0.0–1.2)
CO2: 23 mmol/L (ref 20–29)
Calcium: 9.7 mg/dL (ref 8.7–10.2)
Chloride: 99 mmol/L (ref 96–106)
Creatinine, Ser: 0.84 mg/dL (ref 0.57–1.00)
GFR calc Af Amer: 90 mL/min/{1.73_m2} (ref >59)
GFR calc non Af Amer: 78 mL/min/{1.73_m2} (ref >59)
Globulin, Total: 3 g/dL (ref 1.5–4.5)
Glucose: 106 mg/dL — ABNORMAL HIGH (ref 65–99)
Potassium: 3.7 mmol/L (ref 3.5–5.2)
Sodium: 141 mmol/L (ref 134–144)
Total Protein: 7.3 g/dL (ref 6.0–8.5)

## 2018-08-20 LAB — LIPID PANEL
Chol/HDL Ratio: 3.2 ratio (ref 0.0–4.4)
Cholesterol, Total: 187 mg/dL (ref 100–199)
HDL: 58 mg/dL (ref >39)
LDL Calculated: 94 mg/dL (ref 0–99)
Triglycerides: 175 mg/dL — ABNORMAL HIGH (ref 0–149)
VLDL Cholesterol Cal: 35 mg/dL (ref 5–40)

## 2018-08-26 ENCOUNTER — Other Ambulatory Visit: Payer: Self-pay | Admitting: Family Medicine

## 2018-08-26 DIAGNOSIS — L309 Dermatitis, unspecified: Secondary | ICD-10-CM

## 2018-08-31 NOTE — Progress Notes (Signed)
The  labs are stable without significant clinical change. Potassium is normal.  All other results are normal or within acceptable limits. No Medication changes at the present time. Please return for your follow up appts.

## 2018-10-14 ENCOUNTER — Encounter (HOSPITAL_COMMUNITY): Payer: Self-pay

## 2018-10-14 ENCOUNTER — Encounter (HOSPITAL_COMMUNITY): Payer: Self-pay | Admitting: *Deleted

## 2018-10-29 ENCOUNTER — Telehealth: Payer: Self-pay | Admitting: Family Medicine

## 2018-10-29 ENCOUNTER — Other Ambulatory Visit: Payer: Self-pay

## 2018-10-29 DIAGNOSIS — L309 Dermatitis, unspecified: Secondary | ICD-10-CM

## 2018-10-29 MED ORDER — FLUOCINONIDE 0.05 % EX CREA: TOPICAL_CREAM | CUTANEOUS | 0 refills | Status: DC

## 2018-10-30 NOTE — Telephone Encounter (Signed)
Filled by NP.

## 2018-11-19 ENCOUNTER — Ambulatory Visit: Payer: Self-pay | Admitting: Family Medicine

## 2018-12-08 ENCOUNTER — Ambulatory Visit (INDEPENDENT_AMBULATORY_CARE_PROVIDER_SITE_OTHER): Payer: Self-pay | Admitting: Family Medicine

## 2018-12-08 ENCOUNTER — Other Ambulatory Visit: Payer: Self-pay

## 2018-12-08 ENCOUNTER — Encounter: Payer: Self-pay | Admitting: Family Medicine

## 2018-12-08 VITALS — BP 138/64 | HR 82 | Temp 98.0°F | Resp 16 | Ht 64.0 in | Wt 193.0 lb

## 2018-12-08 DIAGNOSIS — Z87891 Personal history of nicotine dependence: Secondary | ICD-10-CM

## 2018-12-08 DIAGNOSIS — Z9109 Other allergy status, other than to drugs and biological substances: Secondary | ICD-10-CM

## 2018-12-08 DIAGNOSIS — I10 Essential (primary) hypertension: Secondary | ICD-10-CM

## 2018-12-08 DIAGNOSIS — R05 Cough: Secondary | ICD-10-CM

## 2018-12-08 DIAGNOSIS — R059 Cough, unspecified: Secondary | ICD-10-CM

## 2018-12-08 DIAGNOSIS — E785 Hyperlipidemia, unspecified: Secondary | ICD-10-CM

## 2018-12-08 LAB — POCT URINALYSIS DIPSTICK
Bilirubin, UA: NEGATIVE
Blood, UA: NEGATIVE
Glucose, UA: NEGATIVE
Ketones, UA: NEGATIVE
Leukocytes, UA: NEGATIVE
Nitrite, UA: NEGATIVE
Protein, UA: NEGATIVE
Spec Grav, UA: 1.025 (ref 1.010–1.025)
Urobilinogen, UA: 0.2 E.U./dL
pH, UA: 6.5 (ref 5.0–8.0)

## 2018-12-08 MED ORDER — LEVOCETIRIZINE DIHYDROCHLORIDE 5 MG PO TABS: 5.0000 mg | ORAL_TABLET | Freq: Every evening | ORAL | 1 refills | Status: DC

## 2018-12-08 MED ORDER — HYDROCHLOROTHIAZIDE 25 MG PO TABS: 25.0000 mg | ORAL_TABLET | Freq: Every day | ORAL | 3 refills | Status: DC

## 2018-12-08 MED ORDER — PROMETHAZINE HCL 6.25 MG/5ML PO SYRP: 6.2500 mg | ORAL_SOLUTION | Freq: Four times a day (QID) | ORAL | 0 refills | Status: DC | PRN

## 2018-12-08 NOTE — Progress Notes (Signed)
Established Patient Office Visit  Subjective:  Patient ID: Brenda Francis, female    DOB: 11-04-1960  Age: 58 y.o. MRN: [id number]  CC:  Chief Complaint  Patient presents with  . Hypertension  . Cough    dry cough x 1 month     HPI Brenda LeitzKaren Francis, a 58 year old female with a medical history significant for hypertension presents for a 2220-month follow-up.  Patient states that she has been doing well.  She has been taking hydrochlorothiazide consistently.  She does not check blood pressures at home.  Patient is a former smoker, she quit 1 month ago.  Patient states that she has been attempting to quit smoking for quite some time and finally succeeded.  Patient's cardiac risk factors include hypercholesterolemia, former smoker, and family history of heart disease.  Patient is also complaining of dry cough primarily at night.  Patient has a history of environmental allergies.  Allergies are typically triggered by pollen in the spring and ragweed and fall.  Patient has been taking cetirizine over the past several years.  She says that lately it has not been effective.  Also, patient has attempted over-the-counter Robitussin without success.  Cough has been persistent over the past month.  Patient has been tested for COVID-19, which was negative.  She denies sore throat, headache, blurry vision, shortness of breath, chest pain, heart palpitations, fever, chills, dysuria, nausea, vomiting, or diarrhea.  Past Medical History:  Diagnosis Date  . Essential hypertension   . Tobacco dependence due to cigarettes     History reviewed. No pertinent surgical history.  Family History  Problem Relation Age of Onset  . Cancer Mother   . Diabetes Father     Social History   Socioeconomic History  . Marital status: Single    Spouse name: Not on file  . Number of children: Not on file  . Years of education: Not on file  . Highest education level: Not on file  Occupational History  . Not on file   Social Needs  . Financial resource strain: Not on file  . Food insecurity    Worry: Not on file    Inability: Not on file  . Transportation needs    Medical: Not on file    Non-medical: Not on file  Tobacco Use  . Smoking status: Current Some Day Smoker    Packs/day: 0.25  . Smokeless tobacco: Never Used  Substance and Sexual Activity  . Alcohol use: Yes    Comment: moderation   . Drug use: Never  . Sexual activity: Not on file  Lifestyle  . Physical activity    Days per week: Not on file    Minutes per session: Not on file  . Stress: Not on file  Relationships  . Social Musicianconnections    Talks on phone: Not on file    Gets together: Not on file    Attends religious service: Not on file    Active member of club or organization: Not on file    Attends meetings of clubs or organizations: Not on file    Relationship status: Not on file  . Intimate partner violence    Fear of current or ex partner: Not on file    Emotionally abused: Not on file    Physically abused: Not on file    Forced sexual activity: Not on file  Other Topics Concern  . Not on file  Social History Narrative  . Not on file    Outpatient  Medications Prior to Visit  Medication Sig Dispense Refill  . aspirin EC 81 MG tablet Take 1 tablet (81 mg total) by mouth daily. 90 tablet 3  . atorvastatin (LIPITOR) 20 MG tablet Take 1 tablet (20 mg total) by mouth daily. 90 tablet 3  . fluocinonide cream (LIDEX) 0.05 % APPLY  CREAM EXTERNALLY TWICE DAILY 120 g 0  . cetirizine (ZYRTEC) 10 MG tablet Take 1 tablet (10 mg total) by mouth daily. 30 tablet 5  . hydrochlorothiazide (HYDRODIURIL) 25 MG tablet Take 1 tablet (25 mg total) by mouth daily. 90 tablet 3  . cyclobenzaprine (FLEXERIL) 10 MG tablet Take 1 tablet (10 mg total) by mouth at bedtime. (Patient not taking: Reported on 08/19/2018) 30 tablet 0   No facility-administered medications prior to visit.     No Known Allergies  ROS Review of Systems   Constitutional: Negative.   HENT: Negative.   Eyes: Negative.   Respiratory: Positive for cough.   Cardiovascular: Negative.   Endocrine: Negative.   Genitourinary: Negative.   Musculoskeletal: Negative.   Skin: Negative.   Allergic/Immunologic: Negative.   Neurological: Negative.   Hematological: Negative.   Psychiatric/Behavioral: Negative.       Objective:    Physical Exam  Constitutional: She is oriented to person, place, and time. She appears well-developed and well-nourished.  HENT:  Head: Normocephalic and atraumatic.  Eyes: Pupils are equal, round, and reactive to light.  Neck: Normal range of motion. Neck supple.  Cardiovascular: Normal rate and regular rhythm.  Pulmonary/Chest: Effort normal and breath sounds normal. She has no wheezes. She has no rales.  Abdominal: Soft. Bowel sounds are normal.  Musculoskeletal: Normal range of motion.  Neurological: She is alert and oriented to person, place, and time.  Skin: Skin is warm and dry.  Psychiatric: She has a normal mood and affect. Her behavior is normal. Judgment and thought content normal.    BP 138/64 (BP Location: Left Arm, Patient Position: Sitting, Cuff Size: Normal)   Pulse 82   Temp 98 F (36.7 C) (Oral)   Resp 16   Ht 5\' 4"  (1.626 m)   Wt 193 lb (87.5 kg)   BMI 33.13 kg/m  Wt Readings from Last 3 Encounters:  12/08/18 193 lb (87.5 kg)  08/19/18 195 lb (88.5 kg)  05/21/18 185 lb (83.9 kg)     Health Maintenance Due  Topic Date Due  . MAMMOGRAM  10/30/2012  . COLONOSCOPY  10/30/2012    There are no preventive care reminders to display for this patient.  Lab Results  Component Value Date   TSH 1.750 08/14/2017   Lab Results  Component Value Date   WBC 4.7 08/14/2017   HGB 13.0 08/14/2017   HCT 39.1 08/14/2017   MCV 86 08/14/2017   PLT 313 08/14/2017   Lab Results  Component Value Date   NA 141 08/19/2018   K 3.7 08/19/2018   CO2 23 08/19/2018   GLUCOSE 106 (H) 08/19/2018    BUN 10 08/19/2018   CREATININE 0.84 08/19/2018   BILITOT 0.4 08/19/2018   ALKPHOS 79 08/19/2018   AST 37 08/19/2018   ALT 35 (H) 08/19/2018   PROT 7.3 08/19/2018   ALBUMIN 4.3 08/19/2018   CALCIUM 9.7 08/19/2018   Lab Results  Component Value Date   CHOL 187 08/19/2018   Lab Results  Component Value Date   HDL 58 08/19/2018   Lab Results  Component Value Date   LDLCALC 94 08/19/2018   Lab Results  Component Value Date   TRIG 175 (H) 08/19/2018   Lab Results  Component Value Date   CHOLHDL 3.2 08/19/2018   Lab Results  Component Value Date   HGBA1C 5.5 05/05/2017      Assessment & Plan:   Problem List Items Addressed This Visit      Cardiovascular and Mediastinum   Essential hypertension   Relevant Medications   hydrochlorothiazide (HYDRODIURIL) 25 MG tablet     Other   Hyperlipidemia LDL goal <70   Relevant Medications   hydrochlorothiazide (HYDRODIURIL) 25 MG tablet   Former light tobacco smoker    Other Visit Diagnoses    Environmental allergies    -  Primary   Relevant Medications   levocetirizine (XYZAL) 5 MG tablet   promethazine (PHENERGAN) 6.25 MG/5ML syrup      Meds ordered this encounter  Medications  . levocetirizine (XYZAL) 5 MG tablet    Sig: Take 1 tablet (5 mg total) by mouth every evening.    Dispense:  30 tablet    Refill:  1    Order Specific Question:   Supervising Provider    Answer:   Quentin Angst [id number]  . promethazine (PHENERGAN) 6.25 MG/5ML syrup    Sig: Take 5 mLs (6.25 mg total) by mouth every 6 (six) hours as needed for nausea or vomiting.    Dispense:  120 mL    Refill:  0    Order Specific Question:   Supervising Provider    Answer:   Quentin Angst [id number]  . hydrochlorothiazide (HYDRODIURIL) 25 MG tablet    Sig: Take 1 tablet (25 mg total) by mouth daily.    Dispense:  90 tablet    Refill:  3    Order Specific Question:   Supervising Provider    Answer:   Quentin Angst [id number]    Environmental allergies Discontinue Zyrtec. - levocetirizine (XYZAL) 5 MG tablet; Take 1 tablet (5 mg total) by mouth every evening.  Dispense: 30 tablet; Refill: 1  Essential hypertension - Continue medication, monitor blood pressure at home. Continue DASH diet. Reminder to go to the ER if any CP, SOB, nausea, dizziness, severe HA, changes vision/speech, left arm numbness and tingling and jaw pain.    - hydrochlorothiazide (HYDRODIURIL) 25 MG tablet; Take 1 tablet (25 mg total) by mouth daily.  Dispense: 90 tablet; Refill: 3 - Urinalysis Dipstick  Hyperlipidemia LDL goal <70 The 10-year ASCVD risk score Denman George DC Jr., et al., 2013) is: 11.7%   Values used to calculate the score:     Age: 57 years     Sex: Female     Is Non-Hispanic African American: Yes     Diabetic: No     Tobacco smoker: Yes     Systolic Blood Pressure: 138 mmHg     Is BP treated: Yes     HDL Cholesterol: 58 mg/dL     Total Cholesterol: 187 mg/dL Repeat lipid panel at 6 month follow up.   Former light tobacco smoker Patient congratulated on quitting smoking. Encouraged her to continue in her efforts to remain tobacco free.   Cough: - promethazine (PHENERGAN) 6.25 MG/5ML syrup; Take 5 mLs (6.25 mg total) by mouth every 6 (six) hours as needed for nausea or vomiting.  Dispense: 120 mL; Refill: 0    Follow-up: Return in about 6 months (around 06/07/2019) for hypertension.    Nolon Nations  APRN, MSN, FNP-C Patient Care Center [facility] [address]  [address]  [place], [place] [address] [phone]

## 2019-03-03 ENCOUNTER — Other Ambulatory Visit: Payer: Self-pay | Admitting: Family Medicine

## 2019-03-03 DIAGNOSIS — L309 Dermatitis, unspecified: Secondary | ICD-10-CM

## 2019-03-03 MED ORDER — FLUOCINONIDE 0.05 % EX CREA: TOPICAL_CREAM | CUTANEOUS | 5 refills | Status: DC

## 2019-03-03 NOTE — Progress Notes (Signed)
Meds ordered this encounter  Medications  . fluocinonide cream (LIDEX) 0.05 %    Sig: APPLY  CREAM EXTERNALLY TWICE DAILY    Dispense:  60 g    Refill:  5    Order Specific Question:   Supervising Provider    Answer:   Quentin Angst [[id number]]    Nolon Nations  APRN, MSN, FNP-C Patient Care [facility] [address] [place], [place] [address] [phone]

## 2019-05-31 ENCOUNTER — Other Ambulatory Visit: Payer: Self-pay | Admitting: Family Medicine

## 2019-05-31 DIAGNOSIS — Z9109 Other allergy status, other than to drugs and biological substances: Secondary | ICD-10-CM

## 2019-06-02 ENCOUNTER — Other Ambulatory Visit: Payer: Self-pay | Admitting: Family Medicine

## 2019-06-02 DIAGNOSIS — Z9109 Other allergy status, other than to drugs and biological substances: Secondary | ICD-10-CM

## 2019-06-02 NOTE — Telephone Encounter (Signed)
Please review and advise. Patient taking Xyzal as of 05/2019

## 2019-06-08 ENCOUNTER — Ambulatory Visit (INDEPENDENT_AMBULATORY_CARE_PROVIDER_SITE_OTHER): Payer: Self-pay | Admitting: Family Medicine

## 2019-06-08 ENCOUNTER — Encounter: Payer: Self-pay | Admitting: Family Medicine

## 2019-06-08 ENCOUNTER — Other Ambulatory Visit: Payer: Self-pay

## 2019-06-08 VITALS — BP 138/70 | HR 72 | Ht 64.0 in | Wt 197.6 lb

## 2019-06-08 DIAGNOSIS — F17200 Nicotine dependence, unspecified, uncomplicated: Secondary | ICD-10-CM

## 2019-06-08 DIAGNOSIS — I10 Essential (primary) hypertension: Secondary | ICD-10-CM

## 2019-06-08 DIAGNOSIS — E669 Obesity, unspecified: Secondary | ICD-10-CM

## 2019-06-08 DIAGNOSIS — M25531 Pain in right wrist: Secondary | ICD-10-CM

## 2019-06-08 DIAGNOSIS — L309 Dermatitis, unspecified: Secondary | ICD-10-CM

## 2019-06-08 LAB — POCT URINALYSIS DIPSTICK
Bilirubin, UA: NEGATIVE
Blood, UA: NEGATIVE
Glucose, UA: NEGATIVE
Ketones, UA: NEGATIVE
Nitrite, UA: NEGATIVE
Protein, UA: NEGATIVE
Spec Grav, UA: 1.025 (ref 1.010–1.025)
Urobilinogen, UA: 0.2 E.U./dL
pH, UA: 7 (ref 5.0–8.0)

## 2019-06-08 MED ORDER — FLUOCINONIDE 0.05 % EX CREA: TOPICAL_CREAM | CUTANEOUS | 5 refills | Status: DC

## 2019-06-08 MED ORDER — HYDROCHLOROTHIAZIDE 25 MG PO TABS: 25.0000 mg | ORAL_TABLET | Freq: Every day | ORAL | 3 refills | Status: DC

## 2019-06-08 NOTE — Patient Instructions (Signed)
Cool hot flashes. Dress in layers, have a cold glass of water or go somewhere cooler. Try to pinpoint what triggers your hot flashes. For many women, triggers may include hot beverages, caffeine, spicy foods, alcohol, stress, hot weather and even a warm room.  Decrease vaginal discomfort. Use over-the-counter, water-based vaginal lubricants (Astroglide, K-Y jelly, others), silicone-based lubricants or moisturizers (Replens, others). Choose products that don't contain glycerin, which can cause burning or irritation in women who are sensitive to that chemical. Staying sexually active also helps by increasing blood flow to the vagina.  Get enough sleep. Avoid caffeine, which can make it hard to get to sleep, and avoid drinking too much alcohol, which can interrupt sleep. Exercise during the day, although not right before bedtime. If hot flashes disturb your sleep, you may need to find a way to manage them before you can get adequate rest.  Practice relaxation techniques. Techniques such as deep breathing, paced breathing, guided imagery, massage and progressive muscle relaxation may help with menopausal symptoms. You can find a number of books, CDs and online offerings on different relaxation exercises.  Strengthen your pelvic floor. Pelvic floor muscle exercises, called Kegel exercises, can improve some forms of urinary incontinence.  Eat a balanced diet. Include a variety of fruits, vegetables and whole grains. Limit saturated fats, oils and sugars. Ask your provider if you need calcium or vitamin D supplements to help meet daily requirements.  Don't smoke. Smoking increases your risk of heart disease, stroke, osteoporosis, cancer and a range of other health problems. It may also increase hot flashes and bring on earlier menopause.  Exercise regularly. Get regular physical activity or exercise on most days to help protect against heart disease, diabetes, osteoporosis and other conditions associated with  aging. Black Cohosh, Cimicifuga racemosa oral dosage forms What is this medicine? BLACK COHOSH (blak KOH hosh) or Cimicifuga racemosa is a dietary supplement. It is promoted to relieve symptoms of menopause, such as hot flashes. The FDA has not approved this supplement for any medical use. This supplement may be used for other purposes; ask your health care provider or pharmacist if you have questions. This medicine may be used for other purposes; ask your health care provider or pharmacist if you have questions. What should I tell my health care provider before I take this medicine? They need to know if you have any of these conditions:  breast cancer  cervical, ovarian or uterine cancer  high blood pressure  infertility  liver disease  menstrual changes or irregular periods  unusual vaginal or uterine bleeding  an unusual or allergic reaction to black cohosh, soybeans, tartrazine dye (yellow dye number 5), other medicines, foods, dyes, or preservatives  pregnant or trying to get pregnant  breast-feeding How should I use this medicine? Take this herb by mouth with a glass of water. Follow the directions on the package labeling, or talk to your health care professional. Do not use for longer than 6 months without the advice of a health care professional. Do not use if you are pregnant or breast-feeding. Talk to your obstetrician-gynecologist or certified nurse-midwife. This herb is not for use in children under the age of 18 years. Overdosage: If you think you have taken too much of this medicine contact a poison control center or emergency room at once. NOTE: This medicine is only for you. Do not share this medicine with others. What if I miss a dose? If you miss a dose, take it as soon as you  can. If it is almost time for your next dose, take only that dose. Do not take double or extra doses. What may interact with this medicine?  atorvastatin  cisplatin  fertility  treatments This list may not describe all possible interactions. Give your health care provider a list of all the medicines, herbs, non-prescription drugs, or dietary supplements you use. Also tell them if you smoke, drink alcohol, or use illegal drugs. Some items may interact with your medicine. What should I watch for while using this medicine? Since this herb is derived from a plant, allergic reactions are possible. Stop using this herb if you develop a rash. You may need to see your health care professional, or inform them that this occurred. Report any unusual side effects promptly. If you are taking this herb for menstrual or menopausal symptoms, visit your doctor or health care professional for regular checks on your progress. You should have a complete check-up every 6 months. You will need a regular breast and pelvic exam while on this therapy. Follow the advice of your doctor or health care professional. Women should inform their doctor if they wish to become pregnant or think they might be pregnant. If you have any reason to think you are pregnant, stop taking this herb at once and contact your doctor or health care professional. Herbal or dietary supplements are not regulated like medicines. Rigid quality control standards are not required for dietary supplements. The purity and strength of these products can vary. The safety and effect of this dietary supplement for a certain disease or illness is not well known. This product is not intended to diagnose, treat, cure or prevent any disease. The Food and Drug Administration suggests the following to help consumers protect themselves:  Always read product labels and follow directions.  Natural does not mean a product is safe for humans to take.  Look for products that include USP after the ingredient name. This means that the manufacturer followed the standards of the U.S. Pharmacopoeia.  Supplements made or sold by a nationally known food  or drug company are more likely to be made under tight controls. You can write to the company for more information about how the product was made. What side effects may I notice from receiving this medicine? Side effects that you should report to your doctor or health care professional as soon as possible:  allergic reactions like skin rash, itching or hives, swelling of the face, lips, or tongue  breathing problems  dizziness  palpitations  signs and symptoms of liver injury like dark yellow or brown urine; general ill feeling or flu-like symptoms; light-colored stools; loss of appetite; nausea; right upper belly pain; unusually weak or tired; yellowing of the eyes or skin  unusual vaginal bleeding Side effects that usually do not require medical attention (report to your doctor or health care professional if they continue or are bothersome):  breast tenderness  headache  nausea  upset stomach This list may not describe all possible side effects. Call your doctor for medical advice about side effects. You may report side effects to FDA at 1-800-FDA-1088. Where should I keep my medicine? Keep out of the reach of children. Store at room temperature between 15 and 30 degrees C (59 and 86 degrees C). Throw away any unused herb after the expiration date. NOTE: This sheet is a summary. It may not cover all possible information. If you have questions about this medicine, talk to your doctor, pharmacist, or health  care provider.  2020 Elsevier/Gold Standard (2015-08-02 14:35:09)

## 2019-06-09 LAB — BASIC METABOLIC PANEL
BUN/Creatinine Ratio: 14 (ref 9–23)
BUN: 11 mg/dL (ref 6–24)
CO2: 25 mmol/L (ref 20–29)
Calcium: 10.3 mg/dL — ABNORMAL HIGH (ref 8.7–10.2)
Chloride: 101 mmol/L (ref 96–106)
Creatinine, Ser: 0.81 mg/dL (ref 0.57–1.00)
GFR calc Af Amer: 94 mL/min/{1.73_m2} (ref >59)
GFR calc non Af Amer: 81 mL/min/{1.73_m2} (ref >59)
Glucose: 96 mg/dL (ref 65–99)
Potassium: 4 mmol/L (ref 3.5–5.2)
Sodium: 139 mmol/L (ref 134–144)

## 2019-06-17 ENCOUNTER — Telehealth: Payer: Self-pay

## 2019-06-17 NOTE — Telephone Encounter (Signed)
-----   Message from Lachina M Hollis, FNP sent at 06/16/2019  2:24 PM EDT ----- Regarding: Lab results Please inform patient that previous laboratory values were all within a normal range.  Continue low-salt, low-fat diet divided over small meals throughout the day.  Also, recommend smoking cessation.  Follow-up in 6 months as scheduled.  Lachina Moore Hollis  APRN, MSN, FNP-C [facility] [address]  [place],  [address] [phone]   

## 2019-06-17 NOTE — Telephone Encounter (Signed)
Notified pt of her lab results ,  Pt understood results and no other concerns. Pt was also reminder of her appt. Pt has appt set up 3 months but you mention 6 months does this need to change to 6 months?

## 2019-06-17 NOTE — Telephone Encounter (Signed)
-----   Message from [name], [place] sent at 06/16/2019  2:24 PM EDT ----- Regarding: Lab results Please inform patient that previous laboratory values were all within a normal range.  Continue low-salt, low-fat diet divided over small meals throughout the day.  Also, recommend smoking cessation.  Follow-up in 6 months as scheduled.  Nolon Nations  APRN, MSN, FNP-C Patient Care [facility] [address] [place], [place] [address] [phone]

## 2019-06-17 NOTE — Telephone Encounter (Signed)
Called and l[facility] for patient call back about lab results

## 2019-06-21 DIAGNOSIS — L309 Dermatitis, unspecified: Secondary | ICD-10-CM

## 2019-06-21 DIAGNOSIS — F17200 Nicotine dependence, unspecified, uncomplicated: Secondary | ICD-10-CM | POA: Insufficient documentation

## 2019-06-21 DIAGNOSIS — E669 Obesity, unspecified: Secondary | ICD-10-CM | POA: Insufficient documentation

## 2019-06-21 HISTORY — DX: Dermatitis, unspecified: L30.9

## 2019-06-21 NOTE — Progress Notes (Signed)
[facility]   Established Patient Office Visit  Subjective:  Patient ID: Brenda Francis, female    DOB: 11-09-1960  Age: 59 y.o. MRN: [id number]  CC:  Chief Complaint  Patient presents with  . Follow-up    6 month follow up htn , right wrist swelling, used ice  happen on last week     HPI Brenda Francis is a very pleasant 59 year old female with a medical history significant for essential hypertension and tobacco dependence presents for a follow up of chronic conditions. Also, she is complaining of resent right wrist swelling, which resolved after applying ice. Patient says that she does not recall straining or injuring right wrist. 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 She denies any pain, swelling, or redness at this time.   Hypertension:  Patient also has a history of hypertension that has been mostly controlled on hydrochlorothiazide. She typically does not check her blood pressure at home. Patient has a family condition of hypertension and heart disease. She denies headache, heart palpitations, lower extremity swelling, shortness of breath or chest pain. Patient is a chronic everyday tobacco user. She says that she has cut down on smoking a great deal, but has not been able to fully quit.   Past Medical History:  Diagnosis Date  . Essential hypertension   . Tobacco dependence due to cigarettes     No past surgical history on file.  Family History  Problem Relation Age of Onset  . Cancer Mother   . Diabetes Father     Social History   Socioeconomic History  . Marital status: Single    Spouse name: Not on file  . Number of children: Not on file  . Years of education: Not on file  . Highest education level: Not on file  Occupational History  . Not on file  Tobacco Use  . Smoking status: Current Some Day Smoker    Packs/day: 0.25  . Smokeless tobacco: Never Used  Substance and  Sexual Activity  . Alcohol use: Yes    Comment: moderation   . Drug use: Never  . Sexual activity: Not on file  Other Topics Concern  . Not on file  Social History Narrative  . Not on file   Social Determinants of Health   Financial Resource Strain:   . Difficulty of Paying Living Expenses:   Food Insecurity:   . Worried About Programme researcher, broadcasting/film/video in the Last Year:   . Barista in the Last Year:   Transportation Needs:   . Freight forwarder (Medical):   Marland Kitchen Lack of Transportation (Non-Medical):   Physical Activity:   . Days of Exercise per Week:   . Minutes of Exercise per Session:   Stress:   . Feeling of Stress :   Social Connections:   . Frequency of Communication with Friends and Family:   . Frequency of Social Gatherings with Friends and Family:   . Attends Religious Services:   . Active Member of Clubs or Organizations:   . Attends Banker Meetings:   Marland Kitchen Marital Status:   Intimate Partner Violence:   . Fear of Current or Ex-Partner:   . Emotionally Abused:   Marland Kitchen Physically Abused:   . Sexually Abused:     Outpatient Medications Prior to Visit  Medication Sig Dispense Refill  . aspirin EC 81 MG tablet Take 1  tablet (81 mg total) by mouth daily. 90 tablet 3  . atorvastatin (LIPITOR) 20 MG tablet Take 1 tablet (20 mg total) by mouth daily. 90 tablet 3  . levocetirizine (XYZAL) 5 MG tablet TAKE 1 TABLET BY MOUTH ONCE DAILY IN THE EVENING 30 tablet 0  . cetirizine (ZYRTEC) 10 MG tablet Take 1 tablet by mouth once daily 30 tablet 0  . fluocinonide cream (LIDEX) 0.05 % APPLY  CREAM EXTERNALLY TWICE DAILY 60 g 5  . hydrochlorothiazide (HYDRODIURIL) 25 MG tablet Take 1 tablet (25 mg total) by mouth daily. 90 tablet 3  . cyclobenzaprine (FLEXERIL) 10 MG tablet Take 1 tablet (10 mg total) by mouth at bedtime. (Patient not taking: Reported on 08/19/2018) 30 tablet 0  . promethazine (PHENERGAN) 6.25 MG/5ML syrup Take 5 mLs (6.25 mg total) by mouth every 6  (six) hours as needed for nausea or vomiting. (Patient not taking: Reported on 06/08/2019) 120 mL 0   No facility-administered medications prior to visit.    No Known Allergies  ROS Review of Systems  Constitutional: Negative for activity change and appetite change.  HENT: Negative.   Eyes: Negative.   Respiratory: Negative.   Cardiovascular: Negative.   Gastrointestinal: Negative.   Genitourinary: Negative.   Musculoskeletal: Negative.   Skin: Negative.   Neurological: Negative.   Psychiatric/Behavioral: Negative.       Objective:    Physical Exam  Constitutional: She is oriented to person, place, and time. She appears well-developed and well-nourished.  HENT:  Head: Normocephalic and atraumatic.  Eyes: Pupils are equal, round, and reactive to light.  Pulmonary/Chest: Effort normal and breath sounds normal.  Abdominal: Soft. Bowel sounds are normal.  Musculoskeletal:        General: Normal range of motion.     Cervical back: Normal range of motion.  Neurological: She is alert and oriented to person, place, and time.  Skin: Skin is warm.  Psychiatric: She has a normal mood and affect. Her behavior is normal. Thought content normal.    BP 138/70 (BP Location: Left Arm, Patient Position: Sitting)   Pulse 72   Ht 5\' 4"  (1.626 m)   Wt 197 lb 9.6 oz (89.6 kg)   SpO2 98%   BMI 33.92 kg/m  Wt Readings from Last 3 Encounters:  06/08/19 197 lb 9.6 oz (89.6 kg)  12/08/18 193 lb (87.5 kg)  08/19/18 195 lb (88.5 kg)     Health Maintenance Due  Topic Date Due  . COVID-19 Vaccine (1) Never done  . MAMMOGRAM  Never done  . COLONOSCOPY  Never done    There are no preventive care reminders to display for this patient.  Lab Results  Component Value Date   TSH 1.750 08/14/2017   Lab Results  Component Value Date   WBC 4.7 08/14/2017   HGB 13.0 08/14/2017   HCT 39.1 08/14/2017   MCV 86 08/14/2017   PLT 313 08/14/2017   Lab Results  Component Value Date   NA 139  06/08/2019   K 4.0 06/08/2019   CO2 25 06/08/2019   GLUCOSE 96 06/08/2019   BUN 11 06/08/2019   CREATININE 0.81 06/08/2019   BILITOT 0.4 08/19/2018   ALKPHOS 79 08/19/2018   AST 37 08/19/2018   ALT 35 (H) 08/19/2018   PROT 7.3 08/19/2018   ALBUMIN 4.3 08/19/2018   CALCIUM 10.3 (H) 06/08/2019   Lab Results  Component Value Date   CHOL 187 08/19/2018   Lab Results  Component Value Date  HDL 58 08/19/2018   Lab Results  Component Value Date   LDLCALC 94 08/19/2018   Lab Results  Component Value Date   TRIG 175 (H) 08/19/2018   Lab Results  Component Value Date   CHOLHDL 3.2 08/19/2018   Lab Results  Component Value Date   HGBA1C 5.5 05/05/2017      Assessment & Plan:   Problem List Items Addressed This Visit      Cardiovascular and Mediastinum   Essential hypertension - Primary   Relevant Medications   hydrochlorothiazide (HYDRODIURIL) 25 MG tablet   Other Relevant Orders   POCT Urinalysis Dipstick (Completed)   Basic Metabolic Panel (Completed)    Other Visit Diagnoses    Eczema, unspecified type       Relevant Medications   fluocinonide cream (LIDEX) 0.05 %      Meds ordered this encounter  Medications  . hydrochlorothiazide (HYDRODIURIL) 25 MG tablet    Sig: Take 1 tablet (25 mg total) by mouth daily.    Dispense:  90 tablet    Refill:  3    Order Specific Question:   Supervising Provider    Answer:   Quentin Angst [id number]  . fluocinonide cream (LIDEX) 0.05 %    Sig: APPLY  CREAM EXTERNALLY TWICE DAILY    Dispense:  60 g    Refill:  5    Order Specific Question:   Supervising Provider    Answer:   Quentin Angst [[id number]]     Essential hypertension Continue medication, monitor blood pressure at home. Continue DASH diet. Reminder to go to the ER if any CP, SOB, nausea, dizziness, severe HA, changes vision/speech, left arm numbness and tingling and jaw pain.    - POCT Urinalysis Dipstick - hydrochlorothiazide  (HYDRODIURIL) 25 MG tablet; Take 1 tablet (25 mg total) by mouth daily.  Dispense: 90 tablet; Refill: 3 - Basic Metabolic Panel  Eczema, unspecified type - fluocinonide cream (LIDEX) 0.05 %; APPLY  CREAM EXTERNALLY TWICE DAILY  Dispense: 60 g; Refill: 5  Right wrist pain Patient is not having wrist pain on today. Recommend rest, ice, heat, and compression if this problem recurs.   Obesity (BMI 30-39.9) Counseled on diet at length. The patient is asked to make an attempt to improve diet and exercise patterns to aid in medical management of this problem.  Tobacco dependence Smoking cessation instruction/counseling given:  counseled patient on the dangers of tobacco use, advised patient to stop smoking, and reviewed strategies to maximize success Follow-up: Follow up in 3 months for hypertension    Nolon Nations  APRN, MSN, FNP-C Patient Care [facility] [address] [place], [place] [address] [phone]

## 2019-07-07 ENCOUNTER — Other Ambulatory Visit: Payer: Self-pay | Admitting: Family Medicine

## 2019-07-07 DIAGNOSIS — Z9109 Other allergy status, other than to drugs and biological substances: Secondary | ICD-10-CM

## 2019-07-23 ENCOUNTER — Other Ambulatory Visit: Payer: Self-pay | Admitting: Family Medicine

## 2019-07-23 DIAGNOSIS — E785 Hyperlipidemia, unspecified: Secondary | ICD-10-CM

## 2019-08-06 ENCOUNTER — Other Ambulatory Visit: Payer: Self-pay | Admitting: Family Medicine

## 2019-08-06 DIAGNOSIS — Z9109 Other allergy status, other than to drugs and biological substances: Secondary | ICD-10-CM

## 2019-09-07 ENCOUNTER — Ambulatory Visit: Payer: Self-pay | Admitting: Family Medicine

## 2019-09-08 ENCOUNTER — Other Ambulatory Visit: Payer: Self-pay | Admitting: Family Medicine

## 2019-09-08 DIAGNOSIS — Z9109 Other allergy status, other than to drugs and biological substances: Secondary | ICD-10-CM

## 2019-10-08 ENCOUNTER — Other Ambulatory Visit: Payer: Self-pay | Admitting: Family Medicine

## 2019-10-08 DIAGNOSIS — Z9109 Other allergy status, other than to drugs and biological substances: Secondary | ICD-10-CM

## 2019-10-14 ENCOUNTER — Other Ambulatory Visit: Payer: Self-pay | Admitting: Family Medicine

## 2019-10-14 ENCOUNTER — Telehealth: Payer: Self-pay | Admitting: Family Medicine

## 2019-10-14 DIAGNOSIS — Z9109 Other allergy status, other than to drugs and biological substances: Secondary | ICD-10-CM

## 2019-10-14 MED ORDER — LEVOCETIRIZINE DIHYDROCHLORIDE 5 MG PO TABS: 5.0000 mg | ORAL_TABLET | Freq: Every evening | ORAL | 11 refills | Status: DC

## 2019-10-14 NOTE — Progress Notes (Signed)
Meds ordered this encounter  Medications  . levocetirizine (XYZAL) 5 MG tablet    Sig: Take 1 tablet (5 mg total) by mouth every evening.    Dispense:  30 tablet    Refill:  11    Order Specific Question:   Supervising Provider    Answer:   JEGEDE, OLUGBEMIGA E [[id number]]    Breanna Shorkey Moore Nakeeta Sebastiani  APRN, MSN, FNP-C [facility] [address]  [place], [place] [address] [phone]  

## 2019-10-18 NOTE — Telephone Encounter (Signed)
Sent to Provider and CMA 

## 2019-10-19 NOTE — Telephone Encounter (Signed)
Refill was sent in on 10-14-2019

## 2019-10-29 ENCOUNTER — Other Ambulatory Visit: Payer: Self-pay | Admitting: Family Medicine

## 2019-10-29 DIAGNOSIS — E785 Hyperlipidemia, unspecified: Secondary | ICD-10-CM

## 2019-11-03 ENCOUNTER — Other Ambulatory Visit: Payer: Self-pay | Admitting: Family Medicine

## 2019-11-03 ENCOUNTER — Telehealth: Payer: Self-pay | Admitting: Family Medicine

## 2019-11-03 DIAGNOSIS — E785 Hyperlipidemia, unspecified: Secondary | ICD-10-CM

## 2019-11-03 MED ORDER — ATORVASTATIN CALCIUM 20 MG PO TABS: 20.0000 mg | ORAL_TABLET | Freq: Every day | ORAL | 3 refills | Status: DC

## 2019-11-03 NOTE — Progress Notes (Signed)
Meds ordered this encounter  ?Medications  ? atorvastatin (LIPITOR) 20 MG tablet  ?  Sig: Take 1 tablet (20 mg total) by mouth daily.  ?  Dispense:  90 tablet  ?  Refill:  3  ?  Order Specific Question:   Supervising Provider  ?  Answer:   JEGEDE, OLUGBEMIGA E [[id number]]  ?  ? ?Brenda Francis Brenda Domanique Huesman  APRN, MSN, FNP-C ?[facility] ?[address]  ?[place], [place] [address] ?[phone] ? ?

## 2019-11-03 NOTE — Telephone Encounter (Signed)
done

## 2019-12-07 ENCOUNTER — Ambulatory Visit (INDEPENDENT_AMBULATORY_CARE_PROVIDER_SITE_OTHER): Payer: Self-pay | Admitting: Family Medicine

## 2019-12-07 ENCOUNTER — Encounter: Payer: Self-pay | Admitting: Family Medicine

## 2019-12-07 ENCOUNTER — Other Ambulatory Visit: Payer: Self-pay

## 2019-12-07 VITALS — BP 141/56 | HR 76 | Temp 97.9°F | Resp 16 | Ht 64.0 in | Wt 202.0 lb

## 2019-12-07 DIAGNOSIS — R635 Abnormal weight gain: Secondary | ICD-10-CM

## 2019-12-07 DIAGNOSIS — E785 Hyperlipidemia, unspecified: Secondary | ICD-10-CM

## 2019-12-07 DIAGNOSIS — M6740 Ganglion, unspecified site: Secondary | ICD-10-CM

## 2019-12-07 DIAGNOSIS — I10 Essential (primary) hypertension: Secondary | ICD-10-CM

## 2019-12-07 DIAGNOSIS — R739 Hyperglycemia, unspecified: Secondary | ICD-10-CM

## 2019-12-07 NOTE — Progress Notes (Signed)
Integrated Behavioral Health Case Management Referral Note  12/07/2019 Name: Brenda Francis MRN: [id number] DOB: 1960-08-30 Brenda Francis is a 59 y.o. year old female who sees Brenda Maroon, FNP for primary care. LCSW was consulted to assess patient's needs and assist the patient with Financial Difficulties related to low income and no health coverage.  Interpreter: No.   Interpreter Name & Language: none  Assessment: Patient experiencing Financial constraints related to low income and no health coverage. Patient does not have health coverage and would like to apply for the [facility].   Intervention: Provided patient with [employer]. Reviewed application and advised patient on supporting documents to be submitted with the application. Advised patient to follow up with CSW for assistance in scheduling appointment with financial counselor at [facility]) to submit the application if needed. Provided [facility] and CSW contact information.   Review of patient status, including review of consultants reports, relevant laboratory and other test results, and collaboration with appropriate care team members and the patient's provider was performed as part of comprehensive patient evaluation and provision of services.    SDOH (Social Determinants of Health) assessments performed: No    Outpatient Encounter Medications as of 12/07/2019  Medication Sig  . atorvastatin (LIPITOR) 20 MG tablet Take 1 tablet (20 mg total) by mouth daily.  . cyclobenzaprine (FLEXERIL) 10 MG tablet Take 1 tablet (10 mg total) by mouth at bedtime. (Patient not taking: Reported on 08/19/2018)  . EQ ASPIRIN ADULT LOW DOSE 81 MG EC tablet Take 1 tablet by mouth once daily  . fluocinonide cream (LIDEX) 0.05 % APPLY  CREAM EXTERNALLY TWICE DAILY  . hydrochlorothiazide (HYDRODIURIL) 25 MG tablet Take 1 tablet (25 mg total) by mouth daily.  Marland Kitchen levocetirizine (XYZAL) 5 MG tablet Take 1  tablet (5 mg total) by mouth every evening.  . promethazine (PHENERGAN) 6.25 MG/5ML syrup Take 5 mLs (6.25 mg total) by mouth every 6 (six) hours as needed for nausea or vomiting. (Patient not taking: Reported on 06/08/2019)   No facility-administered encounter medications on file as of 12/07/2019.    Goals Addressed   None      Follow up Plan: 1. Patient to complete applications and contact CSW if assistance needed. 2. CSW avaialbel to assist in scheduling appointment with financial counselor.  Abigail Butts, LCSW [facility] [phone]

## 2019-12-07 NOTE — Patient Instructions (Addendum)
Hydrate with 32 ounces of fluids per day.  Recommend low-fat carbohydrate diet divided over 5-6 meals throughout the day.  Also, increase exercise to 30 minutes/day, 5 days/week. No medication changes warranted on today Just a reminder, decrease chocolate intake   Hypertension, Adult Hypertension is another name for high blood pressure. High blood pressure forces your heart to work harder to pump blood. This can cause problems over time. There are two numbers in a blood pressure reading. There is a top number (systolic) over a bottom number (diastolic). It is best to have a blood pressure that is below 120/80. Healthy choices can help lower your blood pressure, or you may need medicine to help lower it. What are the causes? The cause of this condition is not known. Some conditions may be related to high blood pressure. What increases the risk?  Smoking.  Having type 2 diabetes mellitus, high cholesterol, or both.  Not getting enough exercise or physical activity.  Being overweight.  Having too much fat, sugar, calories, or salt (sodium) in your diet.  Drinking too much alcohol.  Having long-term (chronic) kidney disease.  Having a family history of high blood pressure.  Age. Risk increases with age.  Race. You may be at higher risk if you are African American.  Gender. Men are at higher risk than women before age 38. After age 72, women are at higher risk than men.  Having obstructive sleep apnea.  Stress. What are the signs or symptoms?  High blood pressure may not cause symptoms. Very high blood pressure (hypertensive crisis) may cause: ? Headache. ? Feelings of worry or nervousness (anxiety). ? Shortness of breath. ? Nosebleed. ? A feeling of being sick to your stomach (nausea). ? Throwing up (vomiting). ? Changes in how you see. ? Very bad chest pain. ? Seizures. How is this treated?  This condition is treated by making healthy lifestyle changes, such  as: ? Eating healthy foods. ? Exercising more. ? Drinking less alcohol.  Your health care provider may prescribe medicine if lifestyle changes are not enough to get your blood pressure under control, and if: ? Your top number is above 130. ? Your bottom number is above 80.  Your personal target blood pressure may vary. Follow these instructions at home: Eating and drinking   If told, follow the DASH eating plan. To follow this plan: ? Fill one half of your plate at each meal with fruits and vegetables. ? Fill one fourth of your plate at each meal with whole grains. Whole grains include whole-wheat pasta, brown rice, and whole-grain bread. ? Eat or drink low-fat dairy products, such as skim milk or low-fat yogurt. ? Fill one fourth of your plate at each meal with low-fat (lean) proteins. Low-fat proteins include fish, chicken without skin, eggs, beans, and tofu. ? Avoid fatty meat, cured and processed meat, or chicken with skin. ? Avoid pre-made or processed food.  Eat less than 1,500 mg of salt each day.  Do not drink alcohol if: ? Your doctor tells you not to drink. ? You are pregnant, may be pregnant, or are planning to become pregnant.  If you drink alcohol: ? Limit how much you use to:  0-1 drink a day for women.  0-2 drinks a day for men. ? Be aware of how much alcohol is in your drink. In the [place] one drink equals one 12 oz bottle of beer (355 mL), one 5 oz glass of wine (148 mL), or one 1  oz glass of hard liquor (44 mL). Lifestyle   Work with your doctor to stay at a healthy weight or to lose weight. Ask your doctor what the best weight is for you.  Get at least 30 minutes of exercise most days of the week. This may include walking, swimming, or biking.  Get at least 30 minutes of exercise that strengthens your muscles (resistance exercise) at least 3 days a week. This may include lifting weights or doing Pilates.  Do not use any products that contain nicotine or  tobacco, such as cigarettes, e-cigarettes, and chewing tobacco. If you need help quitting, ask your doctor.  Check your blood pressure at home as told by your doctor.  Keep all follow-up visits as told by your doctor. This is important. Medicines  Take over-the-counter and prescription medicines only as told by your doctor. Follow directions carefully.  Do not skip doses of blood pressure medicine. The medicine does not work as well if you skip doses. Skipping doses also puts you at risk for problems.  Ask your doctor about side effects or reactions to medicines that you should watch for. Contact a doctor if you:  Think you are having a reaction to the medicine you are taking.  Have headaches that keep coming back (recurring).  Feel dizzy.  Have swelling in your ankles.  Have trouble with your vision. Get help right away if you:  Get a very bad headache.  Start to feel mixed up (confused).  Feel weak or numb.  Feel faint.  Have very bad pain in your: ? Chest. ? Belly (abdomen).  Throw up more than once.  Have trouble breathing. Summary  Hypertension is another name for high blood pressure.  High blood pressure forces your heart to work harder to pump blood.  For most people, a normal blood pressure is less than 120/80.  Making healthy choices can help lower blood pressure. If your blood pressure does not get lower with healthy choices, you may need to take medicine. This information is not intended to replace advice given to you by your health care provider. Make sure you discuss any questions you have with your health care provider. Document Revised: 10/01/2017 Document Reviewed: 10/01/2017 Elsevier Patient Education  2020 [employer].

## 2019-12-07 NOTE — Progress Notes (Signed)
[facility]     Subjective:  Patient ID: Brenda Francis, female    DOB: 17-Aug-1960  Age: 59 y.o. MRN: [id number]  CC:  Chief Complaint  Patient presents with  . Hypertension    HPI Brenda Francis is a 59 year old female with a medical history significant for essential hypertension, hyperlipidemia, obesity, and tobacco dependence presents for a follow-up of chronic conditions.  Patient states that she has been doing very well and is without complaint on today.  She has not been following a low-fat, low carbohydrate diet.  Patient states that she has been eating an increased amount of chocolate and an increase in sweetened drinks over the past several months. Patient currently denies any headache, dizziness, chest pain, heart palpitation, urinary symptoms, nausea, vomiting, or diarrhea.  No lower extremity swelling.  No sick contacts, recent travel, or exposure to COVID-19. Of note, patient has not been vaccinated against influenza or COVID-19.  She is not up-to-date with colonoscopy or mammogram due to insurance constraints.  Past Medical History:  Diagnosis Date  . Essential hypertension   . Tobacco dependence due to cigarettes     No past surgical history on file.  Family History  Problem Relation Age of Onset  . Cancer Mother   . Diabetes Father     Social History   Socioeconomic History  . Marital status: Single    Spouse name: Not on file  . Number of children: Not on file  . Years of education: Not on file  . Highest education level: Not on file  Occupational History  . Not on file  Tobacco Use  . Smoking status: Current Some Day Smoker    Packs/day: 0.25  . Smokeless tobacco: Never Used  Vaping Use  . Vaping Use: Never used  Substance and Sexual Activity  . Alcohol use: Yes    Comment: moderation   . Drug use: Never  . Sexual activity: Not on file  Other Topics Concern  . Not on file  Social History Narrative    . Not on file   Social Determinants of Health   Financial Resource Strain:   . Difficulty of Paying Living Expenses: Not on file  Food Insecurity:   . Worried About Programme researcher, broadcasting/film/video in the Last Year: Not on file  . Ran Out of Food in the Last Year: Not on file  Transportation Needs:   . Lack of Transportation (Medical): Not on file  . Lack of Transportation (Non-Medical): Not on file  Physical Activity:   . Days of Exercise per Week: Not on file  . Minutes of Exercise per Session: Not on file  Stress:   . Feeling of Stress : Not on file  Social Connections:   . Frequency of Communication with Friends and Family: Not on file  . Frequency of Social Gatherings with Friends and Family: Not on file  . Attends Religious Services: Not on file  . Active Member of Clubs or Organizations: Not on file  . Attends Banker Meetings: Not on file  . Marital Status: Not on file  Intimate Partner Violence:   . Fear of Current or Ex-Partner: Not on file  . Emotionally Abused: Not on file  . Physically Abused: Not on file  . Sexually Abused: Not on file    Outpatient Medications Prior to Visit  Medication Sig Dispense Refill  . atorvastatin (LIPITOR) 20 MG tablet Take 1 tablet (20 mg  total) by mouth daily. 90 tablet 3  . cyclobenzaprine (FLEXERIL) 10 MG tablet Take 1 tablet (10 mg total) by mouth at bedtime. (Patient not taking: Reported on 08/19/2018) 30 tablet 0  . EQ ASPIRIN ADULT LOW DOSE 81 MG EC tablet Take 1 tablet by mouth once daily 90 tablet 0  . fluocinonide cream (LIDEX) 0.05 % APPLY  CREAM EXTERNALLY TWICE DAILY 60 g 5  . hydrochlorothiazide (HYDRODIURIL) 25 MG tablet Take 1 tablet (25 mg total) by mouth daily. 90 tablet 3  . levocetirizine (XYZAL) 5 MG tablet Take 1 tablet (5 mg total) by mouth every evening. 30 tablet 11  . promethazine (PHENERGAN) 6.25 MG/5ML syrup Take 5 mLs (6.25 mg total) by mouth every 6 (six) hours as needed for nausea or vomiting. (Patient  not taking: Reported on 06/08/2019) 120 mL 0   No facility-administered medications prior to visit.    No Known Allergies  ROS Review of Systems  Constitutional: Negative for chills and diaphoresis.  HENT: Negative.   Eyes: Negative.   Respiratory: Negative.  Negative for shortness of breath.   Endocrine: Negative for polydipsia, polyphagia and polyuria.  Genitourinary: Negative.   Musculoskeletal: Negative for arthralgias and back pain.  Neurological: Negative.       Objective:    Physical Exam Constitutional:      Appearance: Normal appearance.  HENT:     Mouth/Throat:     Mouth: Mucous membranes are moist.     Pharynx: Oropharynx is clear.  Eyes:     Pupils: Pupils are equal, round, and reactive to light.  Cardiovascular:     Rate and Rhythm: Normal rate and regular rhythm.     Pulses: Normal pulses.  Pulmonary:     Effort: Pulmonary effort is normal.  Abdominal:     General: Bowel sounds are normal.  Skin:    Comments: Round raised ganglion cyst to right thumb, tender to palpation.  Also ganglion cyst noted on right wrist, nontender.  Neurological:     General: No focal deficit present.     Mental Status: She is alert. Mental status is at baseline.  Psychiatric:        Mood and Affect: Mood normal.        Behavior: Behavior normal.        Thought Content: Thought content normal.        Judgment: Judgment normal.     There were no vitals taken for this visit. Wt Readings from Last 3 Encounters:  06/08/19 197 lb 9.6 oz (89.6 kg)  12/08/18 193 lb (87.5 kg)  08/19/18 195 lb (88.5 kg)     Health Maintenance Due  Topic Date Due  . COVID-19 Vaccine (1) Never done  . MAMMOGRAM  Never done  . COLONOSCOPY  Never done  . INFLUENZA VACCINE  Never done    There are no preventive care reminders to display for this patient.  Lab Results  Component Value Date   TSH 1.750 08/14/2017   Lab Results  Component Value Date   WBC 4.7 08/14/2017   HGB 13.0  08/14/2017   HCT 39.1 08/14/2017   MCV 86 08/14/2017   PLT 313 08/14/2017   Lab Results  Component Value Date   NA 139 06/08/2019   K 4.0 06/08/2019   CO2 25 06/08/2019   GLUCOSE 96 06/08/2019   BUN 11 06/08/2019   CREATININE 0.81 06/08/2019   BILITOT 0.4 08/19/2018   ALKPHOS 79 08/19/2018   AST 37 08/19/2018  ALT 35 (H) 08/19/2018   PROT 7.3 08/19/2018   ALBUMIN 4.3 08/19/2018   CALCIUM 10.3 (H) 06/08/2019   Lab Results  Component Value Date   CHOL 187 08/19/2018   Lab Results  Component Value Date   HDL 58 08/19/2018   Lab Results  Component Value Date   LDLCALC 94 08/19/2018   Lab Results  Component Value Date   TRIG 175 (H) 08/19/2018   Lab Results  Component Value Date   CHOLHDL 3.2 08/19/2018   Lab Results  Component Value Date   HGBA1C 5.5 05/05/2017      Assessment & Plan:   Problem List Items Addressed This Visit      Cardiovascular and Mediastinum   Essential hypertension - Primary   Relevant Orders   Basic Metabolic Panel   Urinalysis, Routine w reflex microscopic      1. Essential hypertension Blood pressure is at goal on current medication regimen.  No changes warranted.  - Continue medication, monitor blood pressure at home. Continue DASH diet. Reminder to go to the ER if any CP, SOB, nausea, dizziness, severe HA, changes vision/speech, left arm numbness and tingling and jaw pain.    - Basic Metabolic Panel - Urinalysis, Routine w reflex microscopic  2. Weight gain The patient is asked to make an attempt to improve diet and exercise patterns to aid in medical management of this problem. - Hemoglobin A1c  3. Hyperlipidemia LDL goal <70 We will recheck lipid panel in 6 months. The 10-year ASCVD risk score Denman George DC Montez Hageman., et al., 2013) is: 13.3%   Values used to calculate the score:     Age: 40 years     Sex: Female     Is Non-Hispanic African American: Yes     Diabetic: No     Tobacco smoker: Yes     Systolic Blood Pressure:  141 mmHg     Is BP treated: Yes     HDL Cholesterol: 58 mg/dL     Total Cholesterol: 187 mg/dL   4. Hyperglycemia We will review hemoglobin A1c as results become available.  5. Ganglion cyst Continue surveillance every 6 months on ganglion cyst.  No interventions warranted at this time.  Follow-up: Return in about 6 months (around 06/05/2020).    Nolon Nations  APRN, MSN, FNP-C Patient Care [facility] [address] [place], [place] [address] [phone]

## 2019-12-08 ENCOUNTER — Telehealth: Payer: Self-pay | Admitting: Family Medicine

## 2019-12-08 ENCOUNTER — Other Ambulatory Visit: Payer: Self-pay | Admitting: Family Medicine

## 2019-12-08 DIAGNOSIS — E1165 Type 2 diabetes mellitus with hyperglycemia: Secondary | ICD-10-CM

## 2019-12-08 HISTORY — DX: Type 2 diabetes mellitus with hyperglycemia: E11.65

## 2019-12-08 LAB — URINALYSIS, ROUTINE W REFLEX MICROSCOPIC
Bilirubin, UA: NEGATIVE
Glucose, UA: NEGATIVE
Ketones, UA: NEGATIVE
Leukocytes,UA: NEGATIVE
Nitrite, UA: NEGATIVE
Protein,UA: NEGATIVE
RBC, UA: NEGATIVE
Specific Gravity, UA: 1.016 (ref 1.005–1.030)
Urobilinogen, Ur: 0.2 mg/dL (ref 0.2–1.0)
pH, UA: 5.5 (ref 5.0–7.5)

## 2019-12-08 LAB — BASIC METABOLIC PANEL
BUN/Creatinine Ratio: 14 (ref 9–23)
BUN: 13 mg/dL (ref 6–24)
CO2: 25 mmol/L (ref 20–29)
Calcium: 10.6 mg/dL — ABNORMAL HIGH (ref 8.7–10.2)
Chloride: 98 mmol/L (ref 96–106)
Creatinine, Ser: 0.91 mg/dL (ref 0.57–1.00)
GFR calc Af Amer: 81 mL/min/{1.73_m2} (ref >59)
GFR calc non Af Amer: 70 mL/min/{1.73_m2} (ref >59)
Glucose: 127 mg/dL — ABNORMAL HIGH (ref 65–99)
Potassium: 3.8 mmol/L (ref 3.5–5.2)
Sodium: 138 mmol/L (ref 134–144)

## 2019-12-08 LAB — HEMOGLOBIN A1C
Est. average glucose Bld gHb Est-mCnc: 146 mg/dL
Hgb A1c MFr Bld: 6.7 % — ABNORMAL HIGH (ref 4.8–5.6)

## 2019-12-08 MED ORDER — METFORMIN HCL 500 MG PO TABS: 500.0000 mg | ORAL_TABLET | Freq: Two times a day (BID) | ORAL | 3 refills | Status: DC

## 2019-12-08 NOTE — Progress Notes (Signed)
Meds ordered this encounter  ?Medications  ? metFORMIN (GLUCOPHAGE) 500 MG tablet  ?  Sig: Take 1 tablet (500 mg total) by mouth 2 (two) times daily with a meal.  ?  Dispense:  180 tablet  ?  Refill:  3  ?  Order Specific Question:   Supervising Provider  ?  Answer:   JEGEDE, OLUGBEMIGA E [[id number]]  ? Brenda Innes Moore Mayfield Schoene  APRN, MSN, FNP-C ?[facility] ?[facility] ?[address]  ?[place], [place] [address] ?[phone] ? ?

## 2019-12-08 NOTE — Telephone Encounter (Signed)
Notify patient by phone to discuss labs.  No answer.  Left message.  Also, will attempt to call back at a later time.   Nolon Nations  APRN, MSN, FNP-C Patient Care [facility] [address] [place], [place] [address] [phone]

## 2019-12-08 NOTE — Telephone Encounter (Signed)
Brenda Francis is a 59 year old female with a medical history significant for essential hypertension that was evaluated in clinic on 12/07/2019.  Patient's hemoglobin A1c was elevated at 6.7, which is consistent with type 2 diabetes mellitus.  Patient says that she has a family history of diabetes.  Her father had type 2 diabetes. Discussed starting a carbohydrate modified diet at length.  Will leave diabetes education materials will receptionist for patient to pick up.  Also, start Metformin 500 mg twice daily with meals.  Recommend 150 minutes of exercise per week.  Continue with antihypertensive and statin therapy.  Patient advised to check feet on a daily basis.  Also, discussed food options.   Nolon Nations  APRN, MSN, FNP-C Patient Care [facility] [address] [place], [place] [address] [phone]

## 2019-12-13 ENCOUNTER — Other Ambulatory Visit: Payer: Self-pay | Admitting: Family Medicine

## 2019-12-13 DIAGNOSIS — L309 Dermatitis, unspecified: Secondary | ICD-10-CM

## 2019-12-13 NOTE — Telephone Encounter (Signed)
Please see refill request.

## 2020-01-03 ENCOUNTER — Other Ambulatory Visit: Payer: Self-pay | Admitting: Family Medicine

## 2020-01-03 DIAGNOSIS — L309 Dermatitis, unspecified: Secondary | ICD-10-CM

## 2020-01-03 NOTE — Telephone Encounter (Signed)
Please see patient refill request.

## 2020-02-15 ENCOUNTER — Other Ambulatory Visit: Payer: Self-pay | Admitting: Family Medicine

## 2020-02-15 ENCOUNTER — Other Ambulatory Visit: Payer: Self-pay | Admitting: Nurse Practitioner

## 2020-02-15 DIAGNOSIS — E785 Hyperlipidemia, unspecified: Secondary | ICD-10-CM

## 2020-02-15 DIAGNOSIS — L309 Dermatitis, unspecified: Secondary | ICD-10-CM

## 2020-02-15 NOTE — Telephone Encounter (Signed)
Patient has request refill for medication "Fluocinonide". Medication last refilled 01/03/2020 with 60 grams to be used twice daily on affected area. Medication pend and sent to PCP [name], FNP . Please Advise.

## 2020-03-18 ENCOUNTER — Other Ambulatory Visit: Payer: Self-pay | Admitting: Family Medicine

## 2020-03-18 DIAGNOSIS — L309 Dermatitis, unspecified: Secondary | ICD-10-CM

## 2020-04-13 ENCOUNTER — Other Ambulatory Visit: Payer: Self-pay | Admitting: Family Medicine

## 2020-04-13 DIAGNOSIS — L309 Dermatitis, unspecified: Secondary | ICD-10-CM

## 2020-05-01 ENCOUNTER — Other Ambulatory Visit: Payer: Self-pay | Admitting: Family Medicine

## 2020-05-01 DIAGNOSIS — L309 Dermatitis, unspecified: Secondary | ICD-10-CM

## 2020-05-29 ENCOUNTER — Other Ambulatory Visit: Payer: Self-pay | Admitting: Family Medicine

## 2020-05-29 DIAGNOSIS — I10 Essential (primary) hypertension: Secondary | ICD-10-CM

## 2020-06-06 ENCOUNTER — Ambulatory Visit (INDEPENDENT_AMBULATORY_CARE_PROVIDER_SITE_OTHER): Payer: Self-pay | Admitting: Family Medicine

## 2020-06-06 ENCOUNTER — Encounter: Payer: Self-pay | Admitting: Family Medicine

## 2020-06-06 ENCOUNTER — Other Ambulatory Visit: Payer: Self-pay

## 2020-06-06 VITALS — BP 142/54 | HR 74 | Temp 97.0°F | Ht 64.0 in | Wt 190.0 lb

## 2020-06-06 DIAGNOSIS — E669 Obesity, unspecified: Secondary | ICD-10-CM

## 2020-06-06 DIAGNOSIS — I10 Essential (primary) hypertension: Secondary | ICD-10-CM

## 2020-06-06 DIAGNOSIS — Z87891 Personal history of nicotine dependence: Secondary | ICD-10-CM

## 2020-06-06 DIAGNOSIS — E1165 Type 2 diabetes mellitus with hyperglycemia: Secondary | ICD-10-CM

## 2020-06-06 LAB — POCT URINALYSIS DIPSTICK
Bilirubin, UA: NEGATIVE
Blood, UA: NEGATIVE
Glucose, UA: NEGATIVE
Nitrite, UA: NEGATIVE
Protein, UA: NEGATIVE
Spec Grav, UA: >=1.03 — AB (ref 1.010–1.025)
Urobilinogen, UA: 1 E.U./dL
pH, UA: 6 (ref 5.0–8.0)

## 2020-06-06 LAB — POCT GLYCOSYLATED HEMOGLOBIN (HGB A1C)
HbA1c POC (<> result, manual entry): 5.8 % (ref 4.0–5.6)
HbA1c, POC (controlled diabetic range): 5.8 % (ref 0.0–7.0)
HbA1c, POC (prediabetic range): 5.8 % (ref 5.7–6.4)
Hemoglobin A1C: 5.8 % — AB (ref 4.0–5.6)

## 2020-06-06 NOTE — Progress Notes (Signed)
[facility]   Established Patient Office Visit  Subjective:  Patient ID: Brenda Francis, female    DOB: [birth year]  Age: 60 y.o. MRN: [id number]  CC:  Chief Complaint  Patient presents with  . Follow-up    6 month follow up     HPI [name] is a 60 year old female with a medical history significant for type 2 DM, hypertension and tobacco dependence that presents for a follow up of chronic conditions.   Diabetes She has type 2 diabetes mellitus. Her disease course has been improving. Pertinent negatives for diabetes include no blurred vision, no chest pain, no fatigue, no polydipsia, no polyphagia, no polyuria, no weakness and no weight loss. Risk factors for coronary artery disease include hypertension and sedentary lifestyle. She is compliant with treatment all of the time. She is following a generally healthy diet. An ACE inhibitor/angiotensin II receptor blocker is being taken. She does not see a podiatrist.Eye exam is not current.  Hypertension This is a chronic problem. The problem is controlled. Pertinent negatives include no blurred vision or chest pain. Risk factors for coronary artery disease include obesity, sedentary lifestyle and smoking/tobacco exposure. Compliance problems include exercise and diet.  There is no history of kidney disease, CAD/MI, heart failure or left ventricular hypertrophy.     Past Medical History:  Diagnosis Date  . Essential hypertension   . Tobacco dependence due to cigarettes     No past surgical history on file.  Family History  Problem Relation Age of Onset  . Cancer Mother   . Diabetes Father     Social History   Socioeconomic History  . Marital status: Single    Spouse name: Not on file  . Number of children: Not on file  . Years of education: Not on file  . Highest education level: Not on file  Occupational History  . Not on file  Tobacco Use  . Smoking status: Current Some  Day Smoker    Packs/day: 0.25  . Smokeless tobacco: Never Used  Vaping Use  . Vaping Use: Never used  Substance and Sexual Activity  . Alcohol use: Yes    Comment: moderation   . Drug use: Never  . Sexual activity: Not on file  Other Topics Concern  . Not on file  Social History Narrative  . Not on file   Social Determinants of Health   Financial Resource Strain: Not on file  Food Insecurity: Not on file  Transportation Needs: Not on file  Physical Activity: Not on file  Stress: Not on file  Social Connections: Not on file  Intimate Partner Violence: Not on file    Outpatient Medications Prior to Visit  Medication Sig Dispense Refill  . atorvastatin (LIPITOR) 20 MG tablet Take 1 tablet (20 mg total) by mouth daily. 90 tablet 3  . cyclobenzaprine (FLEXERIL) 10 MG tablet Take 1 tablet (10 mg total) by mouth at bedtime. (Patient not taking: Reported on 08/19/2018) 30 tablet 0  . EQ ASPIRIN ADULT LOW DOSE 81 MG EC tablet Take 1 tablet by mouth once daily 90 tablet 0  . fluocinonide cream (LIDEX) 0.05 % APPLY  CREAM EXTERNALLY TO AFFECTED AREA TWICE DAILY 60 g 0  . hydrochlorothiazide (HYDRODIURIL) 25 MG tablet Take 1 tablet by mouth once daily 90 tablet 0  . levocetirizine (XYZAL) 5 MG tablet Take 1 tablet (5 mg total) by mouth every evening. 30 tablet 11  . metFORMIN (GLUCOPHAGE)  500 MG tablet Take 1 tablet (500 mg total) by mouth 2 (two) times daily with a meal. 180 tablet 3  . promethazine (PHENERGAN) 6.25 MG/5ML syrup Take 5 mLs (6.25 mg total) by mouth every 6 (six) hours as needed for nausea or vomiting. (Patient not taking: Reported on 06/08/2019) 120 mL 0   No facility-administered medications prior to visit.    No Known Allergies  ROS Review of Systems  Constitutional: Negative for fatigue and weight loss.  HENT: Negative.   Eyes: Negative for blurred vision.  Respiratory: Negative.   Cardiovascular: Negative.  Negative for chest pain.  Gastrointestinal: Negative.    Endocrine: Negative.  Negative for polydipsia, polyphagia and polyuria.  Genitourinary: Negative.   Musculoskeletal: Negative.  Negative for arthralgias and back pain.  Skin: Negative.   Neurological: Negative.  Negative for weakness.      Objective:    Physical Exam Constitutional:      Appearance: She is obese.  Eyes:     Pupils: Pupils are equal, round, and reactive to light.  Cardiovascular:     Rate and Rhythm: Normal rate and regular rhythm.  Pulmonary:     Effort: Pulmonary effort is normal.  Abdominal:     General: Bowel sounds are normal.  Skin:    General: Skin is warm.  Neurological:     General: No focal deficit present.     Mental Status: Mental status is at baseline.  Psychiatric:        Mood and Affect: Mood normal.        Behavior: Behavior normal.        Thought Content: Thought content normal.        Judgment: Judgment normal.     Ht 5\' 4"  (1.626 m)   Wt 190 lb (86.2 kg)   BMI 32.61 kg/m  Wt Readings from Last 3 Encounters:  06/06/20 190 lb (86.2 kg)  12/07/19 202 lb (91.6 kg)  06/08/19 197 lb 9.6 oz (89.6 kg)     Health Maintenance Due  Topic Date Due  . COVID-19 Vaccine (1) Never done  . FOOT EXAM  Never done  . OPHTHALMOLOGY EXAM  Never done  . URINE MICROALBUMIN  08/15/2018  . HEMOGLOBIN A1C  06/05/2020    There are no preventive care reminders to display for this patient.  Lab Results  Component Value Date   TSH 1.750 08/14/2017   Lab Results  Component Value Date   WBC 4.7 08/14/2017   HGB 13.0 08/14/2017   HCT 39.1 08/14/2017   MCV 86 08/14/2017   PLT 313 08/14/2017   Lab Results  Component Value Date   NA 138 12/07/2019   K 3.8 12/07/2019   CO2 25 12/07/2019   GLUCOSE 127 (H) 12/07/2019   BUN 13 12/07/2019   CREATININE 0.91 12/07/2019   BILITOT 0.4 08/19/2018   ALKPHOS 79 08/19/2018   AST 37 08/19/2018   ALT 35 (H) 08/19/2018   PROT 7.3 08/19/2018   ALBUMIN 4.3 08/19/2018   CALCIUM 10.6 (H) 12/07/2019    Lab Results  Component Value Date   CHOL 187 08/19/2018   Lab Results  Component Value Date   HDL 58 08/19/2018   Lab Results  Component Value Date   LDLCALC 94 08/19/2018   Lab Results  Component Value Date   TRIG 175 (H) 08/19/2018   Lab Results  Component Value Date   CHOLHDL 3.2 08/19/2018   Lab Results  Component Value Date   HGBA1C 6.7 (H)  12/07/2019      Assessment & Plan:   Problem List Items Addressed This Visit      Cardiovascular and Mediastinum   Essential hypertension   Relevant Orders   Urinalysis Dipstick     Endocrine   Type 2 diabetes mellitus with hyperglycemia, without long-term current use of insulin (HCC) - Primary   Relevant Orders   HgB A1c   Urinalysis Dipstick   Basic Metabolic Panel     Other   Tobacco dependence   Obesity (BMI 30-39.9)      1. Type 2 diabetes mellitus with hyperglycemia, without long-term current use of insulin (HCC) Patient's hemoglobin A1c is greatly improved over the past 6 months.  Today A1c is 5.8, from 6.7.  Patient advised to continue low-fat, low carbohydrate diet divided over small meals throughout the day - HgB A1c - Urinalysis Dipstick - Basic Metabolic Panel - Microalbumin/Creatinine Ratio, Urine  2. Essential hypertension - Continue medication, monitor blood pressure at home. Continue DASH diet. Reminder to go to the ER if any CP, SOB, nausea, dizziness, severe HA, changes vision/speech, left arm numbness and tingling and jaw pain.   - Urinalysis Dipstick - Microalbumin/Creatinine Ratio, Urine  3.  Former smoker. Smoking cessation instruction/counseling given:  commended patient for quitting and reviewed strategies for preventing relapses  4. Obesity (BMI 30-39.9) Patient has lost a total of 12 pounds since previous appointment.  Continue diet and exercise regimen. Follow-up: Return in about 6 months (around 12/07/2020) for diabetes, hypertension.    Nolon Nations  APRN, MSN,  FNP-C Patient Care [facility] [address] [place], [place] [address] [phone]

## 2020-06-06 NOTE — Patient Instructions (Signed)

## 2020-06-07 ENCOUNTER — Telehealth: Payer: Self-pay | Admitting: Family Medicine

## 2020-06-07 ENCOUNTER — Other Ambulatory Visit: Payer: Self-pay | Admitting: Family Medicine

## 2020-06-07 DIAGNOSIS — L309 Dermatitis, unspecified: Secondary | ICD-10-CM

## 2020-06-07 LAB — BASIC METABOLIC PANEL
BUN/Creatinine Ratio: 15 (ref 9–23)
BUN: 12 mg/dL (ref 6–24)
CO2: 23 mmol/L (ref 20–29)
Calcium: 10.1 mg/dL (ref 8.7–10.2)
Chloride: 103 mmol/L (ref 96–106)
Creatinine, Ser: 0.81 mg/dL (ref 0.57–1.00)
Glucose: 134 mg/dL — ABNORMAL HIGH (ref 65–99)
Potassium: 3.6 mmol/L (ref 3.5–5.2)
Sodium: 141 mmol/L (ref 134–144)
eGFR: 85 mL/min/{1.73_m2} (ref >59)

## 2020-06-07 LAB — MICROALBUMIN / CREATININE URINE RATIO
Creatinine, Urine: 148.1 mg/dL
Microalb/Creat Ratio: 4 mg/g creat (ref 0–29)
Microalbumin, Urine: 5.7 ug/mL

## 2020-06-07 NOTE — Telephone Encounter (Signed)
Brenda Francis is a 60 year old female with a medical history significant for essential hypertension and type 2 diabetes mellitus that presented for follow-up on 06/05/2020. Reviewed all laboratory values.  Patient's hemoglobin A1c has improved to 5.8 over the past 6 months.  Her initial hemoglobin A1c was 6.7. Patient has lost a total of 12 pounds .  Blood pressure is at goal. Renal functioning is within normal limits. Congratulated patient on smoking cessation. Patient to follow-up in 6 months for chronic conditions.  Nolon Nations  APRN, MSN, FNP-C Patient Care [facility] [address] [place], [place] [address] [phone]

## 2020-06-08 NOTE — Telephone Encounter (Signed)
Called and lvm pt call us back.

## 2020-06-09 NOTE — Telephone Encounter (Signed)
Called and notified patient of her lab results, pt understood her results  reminded pt  follow up in 6 months

## 2020-07-19 ENCOUNTER — Other Ambulatory Visit: Payer: Self-pay | Admitting: Family Medicine

## 2020-07-19 DIAGNOSIS — L309 Dermatitis, unspecified: Secondary | ICD-10-CM

## 2020-08-09 ENCOUNTER — Other Ambulatory Visit: Payer: Self-pay | Admitting: Family Medicine

## 2020-08-09 DIAGNOSIS — E785 Hyperlipidemia, unspecified: Secondary | ICD-10-CM

## 2020-08-31 ENCOUNTER — Other Ambulatory Visit: Payer: Self-pay | Admitting: Family Medicine

## 2020-08-31 DIAGNOSIS — I10 Essential (primary) hypertension: Secondary | ICD-10-CM

## 2020-09-12 ENCOUNTER — Other Ambulatory Visit: Payer: Self-pay | Admitting: Family Medicine

## 2020-09-12 DIAGNOSIS — L309 Dermatitis, unspecified: Secondary | ICD-10-CM

## 2020-11-06 ENCOUNTER — Other Ambulatory Visit: Payer: Self-pay | Admitting: Family Medicine

## 2020-11-06 DIAGNOSIS — L309 Dermatitis, unspecified: Secondary | ICD-10-CM

## 2020-11-26 ENCOUNTER — Other Ambulatory Visit: Payer: Self-pay | Admitting: Family Medicine

## 2020-11-26 DIAGNOSIS — E785 Hyperlipidemia, unspecified: Secondary | ICD-10-CM

## 2020-11-26 DIAGNOSIS — I10 Essential (primary) hypertension: Secondary | ICD-10-CM

## 2020-11-26 DIAGNOSIS — Z9109 Other allergy status, other than to drugs and biological substances: Secondary | ICD-10-CM

## 2020-12-12 ENCOUNTER — Ambulatory Visit (INDEPENDENT_AMBULATORY_CARE_PROVIDER_SITE_OTHER): Payer: Self-pay | Admitting: Family Medicine

## 2020-12-12 ENCOUNTER — Encounter: Payer: Self-pay | Admitting: Family Medicine

## 2020-12-12 ENCOUNTER — Other Ambulatory Visit: Payer: Self-pay

## 2020-12-12 VITALS — BP 120/54 | HR 68 | Temp 97.2°F | Ht 64.0 in | Wt 182.6 lb

## 2020-12-12 DIAGNOSIS — E1165 Type 2 diabetes mellitus with hyperglycemia: Secondary | ICD-10-CM

## 2020-12-12 DIAGNOSIS — Z87891 Personal history of nicotine dependence: Secondary | ICD-10-CM

## 2020-12-12 DIAGNOSIS — I10 Essential (primary) hypertension: Secondary | ICD-10-CM

## 2020-12-12 DIAGNOSIS — L309 Dermatitis, unspecified: Secondary | ICD-10-CM

## 2020-12-12 DIAGNOSIS — E785 Hyperlipidemia, unspecified: Secondary | ICD-10-CM

## 2020-12-12 LAB — POCT GLYCOSYLATED HEMOGLOBIN (HGB A1C)
HbA1c POC (<> result, manual entry): 5.9 % (ref 4.0–5.6)
HbA1c, POC (controlled diabetic range): 5.9 % (ref 0.0–7.0)
HbA1c, POC (prediabetic range): 5.9 % (ref 5.7–6.4)
Hemoglobin A1C: 5.9 % — AB (ref 4.0–5.6)

## 2020-12-12 LAB — POCT URINALYSIS DIP (CLINITEK)
Bilirubin, UA: NEGATIVE
Blood, UA: NEGATIVE
Glucose, UA: NEGATIVE mg/dL
Ketones, POC UA: NEGATIVE mg/dL
Nitrite, UA: NEGATIVE
POC PROTEIN,UA: NEGATIVE
Spec Grav, UA: 1.01 (ref 1.010–1.025)
Urobilinogen, UA: 0.2 E.U./dL
pH, UA: 6 (ref 5.0–8.0)

## 2020-12-12 MED ORDER — ASPIRIN 81 MG PO TBEC: 81.0000 mg | DELAYED_RELEASE_TABLET | Freq: Every day | ORAL | 5 refills | Status: DC

## 2020-12-12 MED ORDER — FLUOCINONIDE 0.05 % EX CREA: TOPICAL_CREAM | CUTANEOUS | 5 refills | Status: DC

## 2020-12-12 NOTE — Patient Instructions (Signed)
-   Continue medication, monitor blood pressure at home. Continue DASH diet.  Reminder to go to the ER if any CP, SOB, nausea, dizziness, severe HA, changes vision/speech, left arm numbness and tingling and jaw pain.  Your A1C goal is less than 7. Your fasting blood sugar  Upon awakening goal is between 110-140.  Your LDL  (bad cholesterol goal is less than 100 Blood pressure goal is <140/90.  Recommend a lowfat, low carbohydrate diet divided over 5-6 small meals, increase water intake to 6-8 glasses, and 150 minutes per week of cardiovascular exercise.   Take your medications as prescribed Make sure that you are familiar with each one of your medications and what they are used to treat.  If you are unsure of medications, please bring to follow up Will send referral for eye exam  Please keep your scheduled follow up appointment.

## 2020-12-12 NOTE — Progress Notes (Signed)
Patient [facility]   Established Patient Office Visit  Subjective:  Patient ID: Brenda Francis, female    DOB: [birth year]  Age: 60 y.o. MRN: [id number]  CC:  Chief Complaint  Patient presents with   Follow-up    Follow MW:[id number] and Diabetes Pt states that x 1 month she has been having swollen tonsils that last 4 to 5 days.    HPI Brenda Francis is a very pleasant 60 year old female with a medical history significant for hypertension, type 2 diabetes, and hyperlipidemia presents for follow-up of chronic conditions.  Patient states that she has been doing well and has minimal complaints.  Her primary complaint is swollen tonsils several weeks ago that is since resolved.  She denies any throat pain or difficulty swallowing today. Patient has a history of type 2 diabetes.  She has been following a low-fat, low carbohydrate diet divided over small meals throughout the day.  Patient continues to decrease weight by following a strict diet.  She denies any polydipsia, polyuria, or polyphagia. She does not check blood glucose at home. Patient has a history of essential hypertension.  Blood pressures have been within normal range.  She denies any headache, lower extremity swelling, chest pain, or shortness of breath.  Past Medical History:  Diagnosis Date   Essential hypertension    Tobacco dependence due to cigarettes     History reviewed. No pertinent surgical history.  Family History  Problem Relation Age of Onset   Cancer Mother    Diabetes Father     Social History   Socioeconomic History   Marital status: Single    Spouse name: Not on file   Number of children: Not on file   Years of education: Not on file   Highest education level: Not on file  Occupational History   Not on file  Tobacco Use   Smoking status: Former    Packs/day: 0.25    Types: Cigarettes   Smokeless tobacco: Never  Vaping Use   Vaping Use: Never used   Substance and Sexual Activity   Alcohol use: Not Currently    Comment: moderation    Drug use: Never   Sexual activity: Not Currently  Other Topics Concern   Not on file  Social History Narrative   Not on file   Social Determinants of Health   Financial Resource Strain: Not on file  Food Insecurity: Not on file  Transportation Needs: Not on file  Physical Activity: Not on file  Stress: Not on file  Social Connections: Not on file  Intimate Partner Violence: Not on file    Outpatient Medications Prior to Visit  Medication Sig Dispense Refill   atorvastatin (LIPITOR) 20 MG tablet Take 1 tablet by mouth once daily 90 tablet 0   EQ ASPIRIN ADULT LOW DOSE 81 MG EC tablet Take 1 tablet by mouth once daily 90 tablet 0   fluocinonide cream (LIDEX) 0.05 % APPLY  CREAM EXTERNALLY TO AFFECTED AREA TWICE DAILY 60 g 0   hydrochlorothiazide (HYDRODIURIL) 25 MG tablet Take 1 tablet by mouth once daily 90 tablet 0   levocetirizine (XYZAL) 5 MG tablet TAKE 1 TABLET BY MOUTH ONCE DAILY IN THE EVENING 30 tablet 0   metFORMIN (GLUCOPHAGE) 500 MG tablet Take 1 tablet (500 mg total) by mouth 2 (two) times daily with a meal. 180 tablet 3   cyclobenzaprine (FLEXERIL) 10 MG tablet Take 1 tablet (10 mg total) by mouth at bedtime. (Patient  not taking: Reported on 12/12/2020) 30 tablet 0   No facility-administered medications prior to visit.    No Known Allergies  ROS Review of Systems  Constitutional:  Negative for fatigue.  HENT: Negative.    Respiratory: Negative.    Cardiovascular: Negative.   Gastrointestinal: Negative.   Endocrine: Negative for polydipsia, polyphagia and polyuria.  Genitourinary: Negative.   Musculoskeletal:  Positive for arthralgias and back pain.  Skin: Negative.   Neurological: Negative.   Psychiatric/Behavioral: Negative.       Objective:    Physical Exam Constitutional:      Appearance: Normal appearance.  Eyes:     Pupils: Pupils are equal, round, and  reactive to light.  Cardiovascular:     Rate and Rhythm: Normal rate and regular rhythm.  Pulmonary:     Effort: Pulmonary effort is normal.  Abdominal:     General: Bowel sounds are normal.  Musculoskeletal:        General: Normal range of motion.  Skin:    General: Skin is warm.  Neurological:     General: No focal deficit present.     Mental Status: She is alert. Mental status is at baseline.  Psychiatric:        Mood and Affect: Mood normal.        Behavior: Behavior normal.        Thought Content: Thought content normal.        Judgment: Judgment normal.    BP (!) 120/54   Pulse 68   Temp (!) 97.2 F (36.2 C)   Ht _0  (1.626 m)   Wt 182 lb 9.6 oz (82.8 kg)   SpO2 98%   BMI 31.34 kg/m  Wt Readings from Last 3 Encounters:  12/12/20 182 lb 9.6 oz (82.8 kg)  06/06/20 190 lb (86.2 kg)  12/07/19 202 lb (91.6 kg)     Health Maintenance Due  Topic Date Due   COVID-19 Vaccine (1) Never done   OPHTHALMOLOGY EXAM  Never done   COLONOSCOPY (Pts 45-23yr Insurance coverage will need to be confirmed)  Never done   MAMMOGRAM  Never done   Zoster Vaccines- Shingrix (1 of 2) Never done   Pneumococcal Vaccine 17561Years old (2 - PCV) 05/06/2018   INFLUENZA VACCINE  Never done    There are no preventive care reminders to display for this patient.  Lab Results  Component Value Date   TSH 1.750 08/14/2017   Lab Results  Component Value Date   WBC 4.7 08/14/2017   HGB 13.0 08/14/2017   HCT 39.1 08/14/2017   MCV 86 08/14/2017   PLT 313 08/14/2017   Lab Results  Component Value Date   NA 141 06/06/2020   K 3.6 06/06/2020   CO2 23 06/06/2020   GLUCOSE 134 (H) 06/06/2020   BUN 12 06/06/2020   CREATININE 0.81 06/06/2020   BILITOT 0.4 08/19/2018   ALKPHOS 79 08/19/2018   AST 37 08/19/2018   ALT 35 (H) 08/19/2018   PROT 7.3 08/19/2018   ALBUMIN 4.3 08/19/2018   CALCIUM 10.1 06/06/2020   EGFR 85 06/06/2020   Lab Results  Component Value Date   CHOL 187  08/19/2018   Lab Results  Component Value Date   HDL 58 08/19/2018   Lab Results  Component Value Date   LDLCALC 94 08/19/2018   Lab Results  Component Value Date   TRIG 175 (H) 08/19/2018   Lab Results  Component Value Date   CHOLHDL 3.2 08/19/2018  Lab Results  Component Value Date   HGBA1C 5.9 (A) 12/12/2020   HGBA1C 5.9 12/12/2020   HGBA1C 5.9 12/12/2020   HGBA1C 5.9 12/12/2020      Assessment & Plan:   Problem List Items Addressed This Visit       Endocrine   Type 2 diabetes mellitus with hyperglycemia, without long-term current use of insulin (HCC) - Primary   Relevant Orders   HgB A1c (Completed)   POCT URINALYSIS DIP (CLINITEK) (Completed)   1. Type 2 diabetes mellitus with hyperglycemia, without long-term current use of insulin (HCC) Hemoglobin A1c is 5.9.  Patient's hemoglobin A1c remains stable.  Recommend the patient continue a carbohydrate modified diet divided over small meals throughout the day. - HgB A1c - POCT URINALYSIS DIP (CLINITEK)  2. Essential hypertension BP (!) 120/54   Pulse 68   Temp (!) 97.2 F (36.2 C)   Ht _0  (1.626 m)   Wt 182 lb 9.6 oz (82.8 kg)   SpO2 98%   BMI 31.34 kg/m    3. Hyperlipidemia LDL goal <70 The 10-year ASCVD risk score (Arnett DK, et al., 2019) is: 10.3%   Values used to calculate the score:     Age: 4 years     Sex: Female     Is Non-Hispanic African American: Yes     Diabetic: Yes     Tobacco smoker: No     Systolic Blood Pressure: 391 mmHg     Is BP treated: Yes     HDL Cholesterol: 58 mg/dL     Total Cholesterol: 187 mg/dL  - aspirin (EQ ASPIRIN ADULT LOW DOSE) 81 MG EC tablet; Take 1 tablet (81 mg total) by mouth daily. Swallow whole.  Dispense: 90 tablet; Refill: 5  4. Eczema, unspecified type  - fluocinonide cream (LIDEX) 0.05 %; Apply to affected areas twice daily as needed  Dispense: 60 g; Refill: 5  5. Former light cigarette smoker (1-9 per day) Smoking cessation  instruction/counseling given:  commended patient for quitting and reviewed strategies for preventing relapses   Follow-up: Return in about 3 months (around 03/14/2021).   Donia Pounds  APRN, MSN, FNP-C Patient  [facility] [address] [place], [place] [address] [phone]

## 2021-02-24 ENCOUNTER — Other Ambulatory Visit: Payer: Self-pay | Admitting: Family Medicine

## 2021-02-24 DIAGNOSIS — E785 Hyperlipidemia, unspecified: Secondary | ICD-10-CM

## 2021-02-24 DIAGNOSIS — I10 Essential (primary) hypertension: Secondary | ICD-10-CM

## 2021-02-28 ENCOUNTER — Other Ambulatory Visit: Payer: Self-pay | Admitting: Family Medicine

## 2021-02-28 DIAGNOSIS — I10 Essential (primary) hypertension: Secondary | ICD-10-CM

## 2021-03-01 ENCOUNTER — Other Ambulatory Visit: Payer: Self-pay | Admitting: Family Medicine

## 2021-03-01 DIAGNOSIS — Z9109 Other allergy status, other than to drugs and biological substances: Secondary | ICD-10-CM

## 2021-06-12 ENCOUNTER — Ambulatory Visit: Payer: Self-pay | Admitting: Family Medicine

## 2021-07-10 ENCOUNTER — Ambulatory Visit (INDEPENDENT_AMBULATORY_CARE_PROVIDER_SITE_OTHER): Payer: Self-pay | Admitting: Family Medicine

## 2021-07-10 ENCOUNTER — Encounter: Payer: Self-pay | Admitting: Family Medicine

## 2021-07-10 VITALS — BP 162/88 | HR 69 | Temp 97.3°F | Ht 64.0 in | Wt 187.6 lb

## 2021-07-10 DIAGNOSIS — I10 Essential (primary) hypertension: Secondary | ICD-10-CM

## 2021-07-10 DIAGNOSIS — E1165 Type 2 diabetes mellitus with hyperglycemia: Secondary | ICD-10-CM

## 2021-07-10 DIAGNOSIS — Z9109 Other allergy status, other than to drugs and biological substances: Secondary | ICD-10-CM

## 2021-07-10 DIAGNOSIS — L309 Dermatitis, unspecified: Secondary | ICD-10-CM

## 2021-07-10 DIAGNOSIS — Z23 Encounter for immunization: Secondary | ICD-10-CM

## 2021-07-10 DIAGNOSIS — E785 Hyperlipidemia, unspecified: Secondary | ICD-10-CM

## 2021-07-10 MED ORDER — LEVOCETIRIZINE DIHYDROCHLORIDE 5 MG PO TABS: 5.0000 mg | ORAL_TABLET | Freq: Every evening | ORAL | 3 refills | Status: DC

## 2021-07-10 MED ORDER — METFORMIN HCL 500 MG PO TABS: 500.0000 mg | ORAL_TABLET | Freq: Two times a day (BID) | ORAL | 3 refills | Status: DC

## 2021-07-10 MED ORDER — FLUOCINONIDE 0.05 % EX CREA: TOPICAL_CREAM | CUTANEOUS | 5 refills | Status: DC

## 2021-07-10 MED ORDER — ATORVASTATIN CALCIUM 20 MG PO TABS: 20.0000 mg | ORAL_TABLET | Freq: Every day | ORAL | 1 refills | Status: DC

## 2021-07-10 NOTE — Progress Notes (Signed)
Patient [facility]   Established Patient Office Visit  Subjective   Patient ID: Brenda Francis, female    DOB: Mar 25, 1960  Age: 61 y.o. MRN: [id number]  Chief Complaint  Patient presents with   Follow-up    6 month follow up;Diabetes No concerns or issues to discuss today.    Hypertension This is a chronic problem. The problem has been gradually improving since onset. The problem is controlled. Pertinent negatives include no anxiety, blurred vision, chest pain, headaches, orthopnea, palpitations, peripheral edema, PND, shortness of breath or sweats. Risk factors for coronary artery disease include obesity, sedentary lifestyle, diabetes mellitus and dyslipidemia. Compliance problems include diet and exercise.  There is no history of kidney disease or heart failure.  Diabetes She presents for her follow-up diabetic visit. She has type 2 diabetes mellitus. Her disease course has been stable. Pertinent negatives for hypoglycemia include no headaches or sweats. Pertinent negatives for diabetes include no blurred vision and no chest pain. Risk factors for coronary artery disease include hypertension, sedentary lifestyle and obesity.    Patient Active Problem List   Diagnosis Date Noted   Type 2 diabetes mellitus with hyperglycemia, without long-term current use of insulin ([facility]) 12/08/2019   Obesity (BMI 30-39.9) 06/21/2019   Eczema 06/21/2019   Tobacco dependence 06/21/2019   Former light cigarette smoker (1-9 per day) 12/08/2018   Essential hypertension 05/05/2017   Need for hepatitis C screening test 05/05/2017   Hyperlipidemia LDL goal <70 05/05/2017   Past Medical History:  Diagnosis Date   Essential hypertension    Tobacco dependence due to cigarettes    History reviewed. No pertinent surgical history. Social History   Tobacco Use   Smoking status: Former    Packs/day: 0.25    Types: Cigarettes   Smokeless tobacco: Never  Vaping Use    Vaping Use: Never used  Substance Use Topics   Alcohol use: Not Currently    Comment: moderation    Drug use: Never   Social History   Socioeconomic History   Marital status: Single    Spouse name: Not on file   Number of children: Not on file   Years of education: Not on file   Highest education level: Not on file  Occupational History   Not on file  Tobacco Use   Smoking status: Former    Packs/day: 0.25    Types: Cigarettes   Smokeless tobacco: Never  Vaping Use   Vaping Use: Never used  Substance and Sexual Activity   Alcohol use: Not Currently    Comment: moderation    Drug use: Never   Sexual activity: Not Currently  Other Topics Concern   Not on file  Social History Narrative   Not on file   Social Determinants of Health   Financial Resource Strain: Not on file  Food Insecurity: Not on file  Transportation Needs: Not on file  Physical Activity: Not on file  Stress: Not on file  Social Connections: Not on file  Intimate Partner Violence: Not on file   Family Status  Relation Name Status   Mother  Deceased   Father  Deceased   Family History  Problem Relation Age of Onset   Cancer Mother    Diabetes Father    No Known Allergies    Review of Systems  Constitutional: Negative.   HENT: Negative.    Eyes:  Negative for blurred vision.  Respiratory:  Negative for shortness of breath.  Cardiovascular:  Negative for chest pain, palpitations, orthopnea and PND.  Gastrointestinal: Negative.   Genitourinary: Negative.   Musculoskeletal:  Positive for back pain and joint pain.  Skin: Negative.   Neurological:  Negative for headaches.  Psychiatric/Behavioral: Negative.        Objective:     BP (!) 162/88   Pulse 69   Temp (!) 97.3 F (36.3 C)   Ht '5\' 4"'  (1.626 m)   Wt 187 lb 9.6 oz (85.1 kg)   SpO2 99%   BMI 32.20 kg/m  BP Readings from Last 3 Encounters:  07/10/21 (!) 162/88  12/12/20 (!) 120/54  06/06/20 (!) 142/54   Wt Readings  from Last 3 Encounters:  07/10/21 187 lb 9.6 oz (85.1 kg)  12/12/20 182 lb 9.6 oz (82.8 kg)  06/06/20 190 lb (86.2 kg)      Physical Exam Constitutional:      Appearance: Normal appearance.  Eyes:     Pupils: Pupils are equal, round, and reactive to light.  Cardiovascular:     Rate and Rhythm: Normal rate and regular rhythm.  Pulmonary:     Effort: Pulmonary effort is normal.  Abdominal:     General: Bowel sounds are normal.  Musculoskeletal:        General: Normal range of motion.  Skin:    General: Skin is warm.  Neurological:     General: No focal deficit present.     Mental Status: She is alert.  Psychiatric:        Mood and Affect: Mood normal.        Behavior: Behavior normal.        Thought Content: Thought content normal.        Judgment: Judgment normal.      No results found for any visits on 07/10/21.  Last CBC Lab Results  Component Value Date   WBC 4.7 08/14/2017   HGB 13.0 08/14/2017   HCT 39.1 08/14/2017   MCV 86 08/14/2017   MCH 28.6 08/14/2017   RDW 13.3 08/14/2017   PLT 313 65/99/3570   Last metabolic panel Lab Results  Component Value Date   GLUCOSE 100 (H) 07/10/2021   NA 140 07/10/2021   K 3.8 07/10/2021   CL 102 07/10/2021   CO2 26 07/10/2021   BUN 12 07/10/2021   CREATININE 0.88 07/10/2021   EGFR 76 07/10/2021   CALCIUM 10.1 07/10/2021   PROT 7.2 07/10/2021   ALBUMIN 4.4 07/10/2021   LABGLOB 3.0 08/19/2018   AGRATIO 1.4 08/19/2018   BILITOT 0.4 07/10/2021   ALKPHOS 88 07/10/2021   AST 11 07/10/2021   ALT 13 07/10/2021   Last lipids Lab Results  Component Value Date   CHOL 171 07/10/2021   HDL 58 07/10/2021   LDLCALC 97 07/10/2021   TRIG 88 07/10/2021   CHOLHDL 2.9 07/10/2021   Last hemoglobin A1c Lab Results  Component Value Date   HGBA1C 6.1 (H) 07/10/2021   Last thyroid functions Lab Results  Component Value Date   TSH 1.750 08/14/2017   Last vitamin D No results found for: "25OHVITD2", "25OHVITD3",  "VD25OH"    The 10-year ASCVD risk score (Arnett DK, et al., 2019) is: 24.6%    Assessment & Plan:   Problem List Items Addressed This Visit       Cardiovascular and Mediastinum   Essential hypertension - Primary   Relevant Medications   atorvastatin (LIPITOR) 20 MG tablet   Other Relevant Orders   CMP and Liver  Endocrine   Type 2 diabetes mellitus with hyperglycemia, without long-term current use of insulin (HCC)   Relevant Medications   metFORMIN (GLUCOPHAGE) 500 MG tablet   atorvastatin (LIPITOR) 20 MG tablet   Other Relevant Orders   CMP and Liver   Hemoglobin A1c     Musculoskeletal and Integument   Eczema   Relevant Medications   fluocinonide cream (LIDEX) 0.05 %     Other   Hyperlipidemia LDL goal <70   Relevant Medications   atorvastatin (LIPITOR) 20 MG tablet   Other Relevant Orders   Lipid Panel   Other Visit Diagnoses     Environmental allergies       Relevant Medications   levocetirizine (XYZAL) 5 MG tablet       Return in about 6 months (around 01/09/2022) for hypertension, hyperlipidemia, diabetes.    Donia Pounds  APRN, MSN, FNP-C Patient [facility] [address] [place], [place] [address] [phone]

## 2021-07-11 LAB — CMP AND LIVER
ALT: 13 IU/L (ref 0–32)
AST: 11 IU/L (ref 0–40)
Albumin: 4.4 g/dL (ref 3.8–4.9)
Alkaline Phosphatase: 88 IU/L (ref 44–121)
BUN: 12 mg/dL (ref 6–24)
Bilirubin Total: 0.4 mg/dL (ref 0.0–1.2)
Bilirubin, Direct: 0.11 mg/dL (ref 0.00–0.40)
CO2: 26 mmol/L (ref 20–29)
Calcium: 10.1 mg/dL (ref 8.7–10.2)
Chloride: 102 mmol/L (ref 96–106)
Creatinine, Ser: 0.88 mg/dL (ref 0.57–1.00)
Glucose: 100 mg/dL — ABNORMAL HIGH (ref 70–99)
Potassium: 3.8 mmol/L (ref 3.5–5.2)
Sodium: 140 mmol/L (ref 134–144)
Total Protein: 7.2 g/dL (ref 6.0–8.5)
eGFR: 76 mL/min/{1.73_m2} (ref >59)

## 2021-07-11 LAB — LIPID PANEL
Chol/HDL Ratio: 2.9 ratio (ref 0.0–4.4)
Cholesterol, Total: 171 mg/dL (ref 100–199)
HDL: 58 mg/dL (ref >39)
LDL Chol Calc (NIH): 97 mg/dL (ref 0–99)
Triglycerides: 88 mg/dL (ref 0–149)
VLDL Cholesterol Cal: 16 mg/dL (ref 5–40)

## 2021-07-11 LAB — HEMOGLOBIN A1C
Est. average glucose Bld gHb Est-mCnc: 128 mg/dL
Hgb A1c MFr Bld: 6.1 % — ABNORMAL HIGH (ref 4.8–5.6)

## 2021-07-11 LAB — MICROALBUMIN / CREATININE URINE RATIO
Creatinine, Urine: 85.1 mg/dL
Microalb/Creat Ratio: <4 mg/g creat (ref 0–29)
Microalbumin, Urine: <3 ug/mL

## 2021-08-20 ENCOUNTER — Other Ambulatory Visit: Payer: Self-pay | Admitting: Family Medicine

## 2021-08-20 DIAGNOSIS — I10 Essential (primary) hypertension: Secondary | ICD-10-CM

## 2021-08-25 ENCOUNTER — Other Ambulatory Visit: Payer: Self-pay | Admitting: Family Medicine

## 2021-08-25 DIAGNOSIS — I10 Essential (primary) hypertension: Secondary | ICD-10-CM

## 2022-01-15 ENCOUNTER — Encounter: Payer: Self-pay | Admitting: Family Medicine

## 2022-01-15 ENCOUNTER — Ambulatory Visit (INDEPENDENT_AMBULATORY_CARE_PROVIDER_SITE_OTHER): Payer: Commercial Managed Care - HMO | Admitting: Family Medicine

## 2022-01-15 VITALS — BP 125/56 | HR 74 | Temp 97.7°F | Ht 64.0 in | Wt 186.0 lb

## 2022-01-15 DIAGNOSIS — I10 Essential (primary) hypertension: Secondary | ICD-10-CM | POA: Diagnosis not present

## 2022-01-15 DIAGNOSIS — R002 Palpitations: Secondary | ICD-10-CM

## 2022-01-15 DIAGNOSIS — E785 Hyperlipidemia, unspecified: Secondary | ICD-10-CM | POA: Diagnosis not present

## 2022-01-15 DIAGNOSIS — E669 Obesity, unspecified: Secondary | ICD-10-CM

## 2022-01-15 DIAGNOSIS — E1165 Type 2 diabetes mellitus with hyperglycemia: Secondary | ICD-10-CM | POA: Diagnosis not present

## 2022-01-15 LAB — POCT GLYCOSYLATED HEMOGLOBIN (HGB A1C): Hemoglobin A1C: 6 % — AB (ref 4.0–5.6)

## 2022-01-15 NOTE — Progress Notes (Signed)
Established Patient Office Visit  Subjective   Patient ID: Brenda Francis, female    DOB: 08-Jan-1961  Age: 61 y.o. MRN: [id number]  Chief Complaint  Patient presents with   Diabetes    Hypertension follow up    Brenda Francis is a 61 year old female with a medical history significant for type 2 DM, hypertension, hyperlipidemia, obesity and environmental allergies presents for a follow up of chronic conditions. Brenda Francis has been doing well and is without complaint.  Patient states that she has been taking all prescribed medications consistently. She noticed that when she takes levocetirizine with other medications, she has a slight reaction of dry mouth and throat swelling.  She states that she is asymptomatic when taking medication alone at bedtime.  She is currently not having any symptoms.  She has not had any new foods, new detergents, or over-the-counter medications.  Diabetes She presents for her follow-up diabetic visit. She has type 2 diabetes mellitus. Pertinent negatives for hypoglycemia include no sweats. Pertinent negatives for diabetes include no blurred vision, no chest pain, no fatigue, no foot paresthesias, no foot ulcerations, no polydipsia, no polyphagia, no polyuria, no visual change, no weakness and no weight loss. Current diabetic treatment includes diet and oral agent (monotherapy). She rarely participates in exercise. She does not see a podiatrist.Eye exam is not current.  Hypertension This is a chronic problem. The problem is controlled. Associated symptoms include palpitations and peripheral edema. Pertinent negatives include no anxiety, blurred vision, chest pain, PND, shortness of breath or sweats. Risk factors for coronary artery disease include dyslipidemia, obesity and diabetes mellitus. The current treatment provides moderate improvement. Compliance problems include exercise.  There is no history of kidney disease, CAD/MI or heart failure.    Patient Active  Problem List   Diagnosis Date Noted   Type 2 diabetes mellitus with hyperglycemia, without long-term current use of insulin ([facility]) 12/08/2019   Obesity (BMI 30-39.9) 06/21/2019   Eczema 06/21/2019   Tobacco dependence 06/21/2019   Former light cigarette smoker (1-9 per day) 12/08/2018   Essential hypertension 05/05/2017   Need for hepatitis C screening test 05/05/2017   Hyperlipidemia LDL goal <70 05/05/2017   Past Medical History:  Diagnosis Date   Essential hypertension    Tobacco dependence due to cigarettes    No past surgical history on file. Social History   Tobacco Use   Smoking status: Former    Packs/day: 0.25    Types: Cigarettes   Smokeless tobacco: Never  Vaping Use   Vaping Use: Never used  Substance Use Topics   Alcohol use: Not Currently    Comment: moderation    Drug use: Never   Social History   Socioeconomic History   Marital status: Single    Spouse name: Not on file   Number of children: Not on file   Years of education: Not on file   Highest education level: Not on file  Occupational History   Not on file  Tobacco Use   Smoking status: Former    Packs/day: 0.25    Types: Cigarettes   Smokeless tobacco: Never  Vaping Use   Vaping Use: Never used  Substance and Sexual Activity   Alcohol use: Not Currently    Comment: moderation    Drug use: Never   Sexual activity: Not Currently  Other Topics Concern   Not on file  Social History Narrative   Not on file   Social Determinants of Health   Financial Resource  Strain: Not on file  Food Insecurity: Not on file  Transportation Needs: Not on file  Physical Activity: Not on file  Stress: Not on file  Social Connections: Not on file  Intimate Partner Violence: Not on file   Family Status  Relation Name Status   Mother  Deceased   Father  Deceased   Family History  Problem Relation Age of Onset   Cancer Mother    Diabetes Father    No Known Allergies    Review of Systems   Constitutional:  Negative for fatigue and weight loss.  Eyes:  Negative for blurred vision.  Respiratory:  Negative for shortness of breath.   Cardiovascular:  Positive for palpitations. Negative for chest pain and PND.  Neurological:  Negative for weakness.  Endo/Heme/Allergies:  Negative for polydipsia and polyphagia.      Objective:     BP (!) 125/56   Pulse 74   Temp 97.7 F (36.5 C)   Ht _0  (1.626 m)   Wt 186 lb (84.4 kg)   SpO2 100%   BMI 31.93 kg/m  BP Readings from Last 3 Encounters:  01/15/22 (!) 125/56  07/10/21 (!) 162/88  12/12/20 (!) 120/54   Wt Readings from Last 3 Encounters:  01/15/22 186 lb (84.4 kg)  07/10/21 187 lb 9.6 oz (85.1 kg)  12/12/20 182 lb 9.6 oz (82.8 kg)      Physical Exam Constitutional:      Appearance: Normal appearance.  Eyes:     Pupils: Pupils are equal, round, and reactive to light.  Cardiovascular:     Rate and Rhythm: Normal rate.     Pulses: Normal pulses.  Abdominal:     General: Bowel sounds are normal.  Musculoskeletal:        General: Normal range of motion.  Neurological:     General: No focal deficit present.     Mental Status: She is alert. Mental status is at baseline.  Psychiatric:        Mood and Affect: Mood normal.        Behavior: Behavior normal.        Thought Content: Thought content normal.        Judgment: Judgment normal.      Results for orders placed or performed in visit on 01/15/22  POCT glycosylated hemoglobin (Hb A1C)  Result Value Ref Range   Hemoglobin A1C 6.0 (A) 4.0 - 5.6 %   HbA1c POC (<> result, manual entry)     HbA1c, POC (prediabetic range)     HbA1c, POC (controlled diabetic range)      Last CBC Lab Results  Component Value Date   WBC 4.7 08/14/2017   HGB 13.0 08/14/2017   HCT 39.1 08/14/2017   MCV 86 08/14/2017   MCH 28.6 08/14/2017   RDW 13.3 08/14/2017   PLT 313 97/03/6376   Last metabolic panel Lab Results  Component Value Date   GLUCOSE 100 (H)  07/10/2021   NA 140 07/10/2021   K 3.8 07/10/2021   CL 102 07/10/2021   CO2 26 07/10/2021   BUN 12 07/10/2021   CREATININE 0.88 07/10/2021   EGFR 76 07/10/2021   CALCIUM 10.1 07/10/2021   PROT 7.2 07/10/2021   ALBUMIN 4.4 07/10/2021   LABGLOB 3.0 08/19/2018   AGRATIO 1.4 08/19/2018   BILITOT 0.4 07/10/2021   ALKPHOS 88 07/10/2021   AST 11 07/10/2021   ALT 13 07/10/2021   Last lipids Lab Results  Component Value Date  CHOL 171 07/10/2021   HDL 58 07/10/2021   LDLCALC 97 07/10/2021   TRIG 88 07/10/2021   CHOLHDL 2.9 07/10/2021   Last hemoglobin A1c Lab Results  Component Value Date   HGBA1C 6.0 (A) 01/15/2022   Last thyroid functions Lab Results  Component Value Date   TSH 1.750 08/14/2017   Last vitamin D No results found for: "25OHVITD2", "25OHVITD3", "VD25OH" Last vitamin B12 and Folate No results found for: "VITAMINB12", "FOLATE"    The 10-year ASCVD risk score (Arnett DK, et al., 2019) is: 11.6%    Assessment & Plan:   Problem List Items Addressed This Visit       Cardiovascular and Mediastinum   Essential hypertension   Relevant Orders   Comprehensive metabolic panel   EKG 25-[facility]     Endocrine   Type 2 diabetes mellitus with hyperglycemia, without long-term current use of insulin ([facility]) - Primary   Relevant Orders   Ambulatory referral to Ophthalmology   POCT glycosylated hemoglobin (Hb A1C) (Completed)   Ambulatory referral to Ophthalmology   Comprehensive metabolic panel     Other   Obesity (BMI 30-39.9)   Hyperlipidemia LDL goal <70   Other Visit Diagnoses     Heart palpitations       Relevant Orders   EKG 12-Lead       Return in about 6 months (around 07/17/2022) for obesity, diabetes, hypertension, hyperlipidemia.     Donia Pounds  APRN, MSN, FNP-C Patient [facility] [address] [place], [place] [address] [phone]

## 2022-01-15 NOTE — Patient Instructions (Signed)
Today, your blood pressure and hemoglobin A1c are at goal.  You do not require any medication changes today.  Continue to follow carbohydrate modified diet divided over small meals per day.  I do recommend that you add healthy fats to your diet including olive oil, avocado, etc.  Also, recommend that you exercise 150 minutes/week per American Heart Association. You warrant a routine eye exam yearly, I have sent a referral to [facility] ophthalmology, please schedule an appointment as soon as possible. Remember your diabetes maintenance that includes diet, checking your feet, yearly eye exam, and medication management.  We will see you in 6 months or as needed.

## 2022-01-16 LAB — COMPREHENSIVE METABOLIC PANEL
ALT: 12 IU/L (ref 0–32)
AST: 11 IU/L (ref 0–40)
Albumin/Globulin Ratio: 1.2 (ref 1.2–2.2)
Albumin: 4.2 g/dL (ref 3.8–4.9)
Alkaline Phosphatase: 88 IU/L (ref 44–121)
BUN/Creatinine Ratio: 15 (ref 9–23)
BUN: 12 mg/dL (ref 6–24)
Bilirubin Total: 0.5 mg/dL (ref 0.0–1.2)
CO2: 24 mmol/L (ref 20–29)
Calcium: 10.6 mg/dL — ABNORMAL HIGH (ref 8.7–10.2)
Chloride: 103 mmol/L (ref 96–106)
Creatinine, Ser: 0.8 mg/dL (ref 0.57–1.00)
Globulin, Total: 3.4 g/dL (ref 1.5–4.5)
Glucose: 127 mg/dL — ABNORMAL HIGH (ref 70–99)
Potassium: 4 mmol/L (ref 3.5–5.2)
Sodium: 141 mmol/L (ref 134–144)
Total Protein: 7.6 g/dL (ref 6.0–8.5)
eGFR: 85 mL/min/{1.73_m2} (ref >59)

## 2022-02-01 ENCOUNTER — Other Ambulatory Visit: Payer: Self-pay | Admitting: Family Medicine

## 2022-02-01 DIAGNOSIS — E785 Hyperlipidemia, unspecified: Secondary | ICD-10-CM

## 2022-02-12 ENCOUNTER — Other Ambulatory Visit: Payer: Self-pay

## 2022-02-12 DIAGNOSIS — I10 Essential (primary) hypertension: Secondary | ICD-10-CM

## 2022-02-12 MED ORDER — HYDROCHLOROTHIAZIDE 25 MG PO TABS: 25.0000 mg | ORAL_TABLET | Freq: Every day | ORAL | 0 refills | Status: DC

## 2022-04-25 ENCOUNTER — Other Ambulatory Visit: Payer: Self-pay | Admitting: Family Medicine

## 2022-04-25 DIAGNOSIS — Z9109 Other allergy status, other than to drugs and biological substances: Secondary | ICD-10-CM

## 2022-05-22 ENCOUNTER — Other Ambulatory Visit: Payer: Self-pay | Admitting: Family Medicine

## 2022-05-22 DIAGNOSIS — Z9109 Other allergy status, other than to drugs and biological substances: Secondary | ICD-10-CM

## 2022-05-31 LAB — HM DIABETES EYE EXAM

## 2022-06-22 ENCOUNTER — Other Ambulatory Visit: Payer: Self-pay | Admitting: Family Medicine

## 2022-06-22 DIAGNOSIS — Z9109 Other allergy status, other than to drugs and biological substances: Secondary | ICD-10-CM

## 2022-07-16 ENCOUNTER — Ambulatory Visit: Payer: Self-pay | Admitting: Family Medicine

## 2022-07-18 ENCOUNTER — Ambulatory Visit (INDEPENDENT_AMBULATORY_CARE_PROVIDER_SITE_OTHER): Payer: BLUE CROSS/BLUE SHIELD | Admitting: Family Medicine

## 2022-07-18 VITALS — BP 137/60 | HR 81 | Temp 97.0°F | Wt 194.0 lb

## 2022-07-18 DIAGNOSIS — I10 Essential (primary) hypertension: Secondary | ICD-10-CM

## 2022-07-18 DIAGNOSIS — Z1211 Encounter for screening for malignant neoplasm of colon: Secondary | ICD-10-CM

## 2022-07-18 DIAGNOSIS — Z1231 Encounter for screening mammogram for malignant neoplasm of breast: Secondary | ICD-10-CM

## 2022-07-18 DIAGNOSIS — R053 Chronic cough: Secondary | ICD-10-CM

## 2022-07-18 DIAGNOSIS — E1165 Type 2 diabetes mellitus with hyperglycemia: Secondary | ICD-10-CM | POA: Diagnosis not present

## 2022-07-18 LAB — POCT GLYCOSYLATED HEMOGLOBIN (HGB A1C): Hemoglobin A1C: 6.3 % — AB (ref 4.0–5.6)

## 2022-07-18 MED ORDER — SEMAGLUTIDE(0.25 OR 0.5MG/DOS) 2 MG/3ML ~~LOC~~ SOPN
0.2500 mg | PEN_INJECTOR | SUBCUTANEOUS | 5 refills | Status: DC
Start: 2022-07-18 — End: 2023-04-08

## 2022-07-18 NOTE — Progress Notes (Signed)
Established Patient Office Visit  Subjective   Patient ID: Brenda Francis, female    DOB: 10-May-1960  Age: 62 y.o. MRN: [id number]  Chief Complaint  Patient presents with   Diabetes    Follow up     Brenda Francis is a 62 year old female with a medical history significant for hypertension, type 2 diabetes mellitus, hyperlipidemia, and obesity presents for follow-up of chronic conditions.  Patient reports that she has had a persistent cough over the over-the-counter medications.  Patient is a former smoker.  She has not smoked over the past several years.  She endorses sick contacts while cough started initially.  She states that it has gotten better over time but has not fully dissipated. Patient has a history of hypertension.  She has been taking prescribed medications consistently.  She does not check her blood pressure at home.  She denies any chest pain, shortness of breath, or lower extremity swelling. Patient has a history of type 2 diabetes mellitus she does not check blood glucose at home.  Patient denies any dizziness, blurred vision, polyuria, polydipsia, or polyphagia.  Diabetes She presents for her follow-up diabetic visit. She has type 2 diabetes mellitus. Pertinent negatives for diabetes include no blurred vision, no chest pain, no fatigue, no foot paresthesias, no foot ulcerations, no polydipsia, no polyphagia, no polyuria, no visual change, no weakness and no weight loss.    Patient Active Problem List   Diagnosis Date Noted   Type 2 diabetes mellitus with hyperglycemia, without long-term current use of insulin (HCC) 12/08/2019   Obesity (BMI 30-39.9) 06/21/2019   Eczema 06/21/2019   Tobacco dependence 06/21/2019   Former light cigarette smoker (1-9 per day) 12/08/2018   Essential hypertension 05/05/2017   Need for hepatitis C screening test 05/05/2017   Hyperlipidemia LDL goal <70 05/05/2017   Past Medical History:  Diagnosis Date   Essential hypertension    Tobacco  dependence due to cigarettes    No past surgical history on file. Social History   Tobacco Use   Smoking status: Former    Packs/day: .25    Types: Cigarettes   Smokeless tobacco: Never  Vaping Use   Vaping Use: Never used  Substance Use Topics   Alcohol use: Not Currently    Comment: moderation    Drug use: Never   Social History   Socioeconomic History   Marital status: Single    Spouse name: Not on file   Number of children: Not on file   Years of education: Not on file   Highest education level: Not on file  Occupational History   Not on file  Tobacco Use   Smoking status: Former    Packs/day: .25    Types: Cigarettes   Smokeless tobacco: Never  Vaping Use   Vaping Use: Never used  Substance and Sexual Activity   Alcohol use: Not Currently    Comment: moderation    Drug use: Never   Sexual activity: Not Currently  Other Topics Concern   Not on file  Social History Narrative   Not on file   Social Determinants of Health   Financial Resource Strain: Not on file  Food Insecurity: Not on file  Transportation Needs: Not on file  Physical Activity: Not on file  Stress: Not on file  Social Connections: Not on file  Intimate Partner Violence: Not on file   Family Status  Relation Name Status   Mother  Deceased   Father  Deceased  Family History  Problem Relation Age of Onset   Cancer Mother    Diabetes Father    No Known Allergies    Review of Systems  Constitutional: Negative.  Negative for fatigue and weight loss.  HENT:  Positive for nosebleeds.   Eyes: Negative.  Negative for blurred vision.  Respiratory: Negative.    Cardiovascular: Negative.  Negative for chest pain.  Gastrointestinal: Negative.   Genitourinary: Negative.   Musculoskeletal: Negative.   Skin: Negative.   Neurological: Negative.  Negative for weakness.  Endo/Heme/Allergies:  Negative for polydipsia and polyphagia.  Psychiatric/Behavioral: Negative.        Objective:      BP 137/60   Pulse 81   Temp (!) 97 F (36.1 C)   Wt 194 lb (88 kg)   SpO2 100%   BMI 33.30 kg/m  BP Readings from Last 3 Encounters:  07/18/22 137/60  01/15/22 (!) 125/56  07/10/21 (!) 162/88   Wt Readings from Last 3 Encounters:  07/18/22 194 lb (88 kg)  01/15/22 186 lb (84.4 kg)  07/10/21 187 lb 9.6 oz (85.1 kg)      Physical Exam Constitutional:      Appearance: Normal appearance.  Eyes:     Pupils: Pupils are equal, round, and reactive to light.  Cardiovascular:     Rate and Rhythm: Normal rate and regular rhythm.  Pulmonary:     Effort: Pulmonary effort is normal.  Abdominal:     General: Bowel sounds are normal.  Musculoskeletal:        General: Normal range of motion.  Skin:    General: Skin is warm.  Neurological:     General: No focal deficit present.     Mental Status: She is alert and oriented to person, place, and time. Mental status is at baseline.  Psychiatric:        Mood and Affect: Mood normal.        Behavior: Behavior normal.        Thought Content: Thought content normal.        Judgment: Judgment normal.     No results found for any visits on 07/18/22.  Last CBC Lab Results  Component Value Date   WBC 4.7 08/14/2017   HGB 13.0 08/14/2017   HCT 39.1 08/14/2017   MCV 86 08/14/2017   MCH 28.6 08/14/2017   RDW 13.3 08/14/2017   PLT 313 08/14/2017   Last metabolic panel Lab Results  Component Value Date   GLUCOSE 127 (H) 01/15/2022   NA 141 01/15/2022   K 4.0 01/15/2022   CL 103 01/15/2022   CO2 24 01/15/2022   BUN 12 01/15/2022   CREATININE 0.80 01/15/2022   EGFR 85 01/15/2022   CALCIUM 10.6 (H) 01/15/2022   PROT 7.6 01/15/2022   ALBUMIN 4.2 01/15/2022   LABGLOB 3.4 01/15/2022   AGRATIO 1.2 01/15/2022   BILITOT 0.5 01/15/2022   ALKPHOS 88 01/15/2022   AST 11 01/15/2022   ALT 12 01/15/2022   Last lipids Lab Results  Component Value Date   CHOL 171 07/10/2021   HDL 58 07/10/2021   LDLCALC 97 07/10/2021   TRIG  88 07/10/2021   CHOLHDL 2.9 07/10/2021   Last hemoglobin A1c Lab Results  Component Value Date   HGBA1C 6.0 (A) 01/15/2022   Last thyroid functions Lab Results  Component Value Date   TSH 1.750 08/14/2017   Last vitamin D No results found for: "25OHVITD2", "25OHVITD3", "VD25OH" Last vitamin B12 and Folate No results  found for: "VITAMINB12", "FOLATE"    The 10-year ASCVD risk score (Arnett DK, et al., 2019) is: 15%    Assessment & Plan:   Problem List Items Addressed This Visit       Cardiovascular and Mediastinum   Essential hypertension - Primary   Relevant Orders   Microalbumin/Creatinine Ratio, Urine     Endocrine   Type 2 diabetes mellitus with hyperglycemia, without long-term current use of insulin (HCC)   Relevant Orders   Microalbumin/Creatinine Ratio, Urine   POCT glycosylated hemoglobin (Hb A1C)   Other Visit Diagnoses     Colon cancer screening       Relevant Orders   Cologuard   Encounter for screening mammogram for malignant neoplasm of breast       Relevant Orders   MM Digital Screening      1. Essential hypertension BP 137/60   Pulse 81   Temp (!) 97 F (36.1 C)   Wt 194 lb (88 kg)   SpO2 100%   BMI 33.30 kg/m  - Continue medication, monitor blood pressure at home. Continue DASH diet.  Reminder to go to the ER if any CP, SOB, nausea, dizziness, severe HA, changes vision/speech, left arm numbness and tingling and jaw pain.   - Microalbumin/Creatinine Ratio, Urine - Comprehensive metabolic panel  2. Type 2 diabetes mellitus with hyperglycemia, without long-term current use of insulin (HCC)  Will start a trial of semaglutide 0.25 mg weekly.  Patient will follow-up by phone in 1 month.  Will recheck A1c in 3 months. - Microalbumin/Creatinine Ratio, Urine - POCT glycosylated hemoglobin (Hb A1C) - Semaglutide,0.25 or 0.5MG /DOS, 2 MG/3ML SOPN; Inject 0.25 mg into the skin once a week.  Dispense: 3 mL; Refill: 5 - Comprehensive metabolic  panel  3. Colon cancer screening  - Cologuard  4. Encounter for screening mammogram for malignant neoplasm of breast  - MM Digital Screening; Future  5. Persistent cough for 3 weeks or longer  - DG Chest 2 View; Future  Return in about 3 months (around 10/18/2022) for diabetes, hypertension.   Nolon Nations  APRN, MSN, FNP-C Patient Care [facility] [address] [place], [place] [address] [phone]

## 2022-07-18 NOTE — Patient Instructions (Signed)
Chest xray at your convenience. Your order has been plan.  Mucinex as directed.  Ozempic 0.25 mg weekly for 4 weeks.     Cough, Adult Coughing is a reflex that clears your throat and airways (respiratory system). It helps heal and protect your lungs. It is normal to cough from time to time. A cough that happens with other symptoms or that lasts a long time may be a sign of a condition that needs treatment. A short-term (acute) cough may only last 2-3 weeks. A long-term (chronic) cough may last 8 or more weeks. Coughing is often caused by: Diseases, such as: An infection of the respiratory system. Asthma or other heart or lung diseases. Gastroesophageal reflux. This is when acid comes back up from the stomach. Breathing in things that irritate your lungs. Allergies. Postnasal drip. This is when mucus runs down the back of your throat. Smoking. Some medicines. Follow these instructions at home: Medicines Take over-the-counter and prescription medicines only as told by your health care provider. Talk with your provider before you take cough medicine (cough suppressants). Eating and drinking Do not drink alcohol. Avoid caffeine. Drink enough fluid to keep your pee (urine) pale yellow. Lifestyle Avoid cigarette smoke. Do not use any products that contain nicotine or tobacco. These products include cigarettes, chewing tobacco, and vaping devices, such as e-cigarettes. If you need help quitting, ask your provider. Avoid things that make you cough. These may include perfumes, candles, cleaning products, or campfire smoke. General instructions  Watch for any changes to your cough. Tell your provider about them. Always cover your mouth when you cough. If the air is dry in your bedroom or home, use a cool mist vaporizer or humidifier. If your cough is worse at night, try to sleep in a semi-upright position. Rest as needed. Contact a health care provider if: You have new symptoms, or your  symptoms get worse. You cough up pus. You have a fever that does not go away or a cough that does not get better after 2-3 weeks. You cannot control your cough with medicine, and you are losing sleep. You have pain that gets worse or is not helped with medicine. You lose weight for no clear reason. You have night sweats. Get help right away if: You cough up blood. You have trouble breathing. Your heart is beating very fast. These symptoms may be an emergency. Get help right away. Call 911. Do not wait to see if the symptoms will go away. Do not drive yourself to the hospital. This information is not intended to replace advice given to you by your health care provider. Make sure you discuss any questions you have with your health care provider. Document Revised: 09/21/2021 Document Reviewed: 09/21/2021 Elsevier Patient Education  2024 [employer].

## 2022-07-19 LAB — COMPREHENSIVE METABOLIC PANEL
ALT: 16 IU/L (ref 0–32)
AST: 14 IU/L (ref 0–40)
Albumin/Globulin Ratio: 1.4
Albumin: 4.2 g/dL (ref 3.8–4.9)
Alkaline Phosphatase: 74 IU/L (ref 44–121)
BUN/Creatinine Ratio: 16 (ref 9–23)
BUN: 16 mg/dL (ref 6–24)
Bilirubin Total: 0.4 mg/dL (ref 0.0–1.2)
CO2: 24 mmol/L (ref 20–29)
Calcium: 10.3 mg/dL — ABNORMAL HIGH (ref 8.7–10.2)
Chloride: 101 mmol/L (ref 96–106)
Creatinine, Ser: 0.99 mg/dL (ref 0.57–1.00)
Globulin, Total: 3 g/dL (ref 1.5–4.5)
Glucose: 151 mg/dL — ABNORMAL HIGH (ref 70–99)
Potassium: 3.6 mmol/L (ref 3.5–5.2)
Sodium: 140 mmol/L (ref 134–144)
Total Protein: 7.2 g/dL (ref 6.0–8.5)
eGFR: 66 mL/min/{1.73_m2} (ref >59)

## 2022-07-19 LAB — MICROALBUMIN / CREATININE URINE RATIO
Creatinine, Urine: 106.5 mg/dL
Microalb/Creat Ratio: 5 mg/g creat (ref 0–29)
Microalbumin, Urine: 5.5 ug/mL

## 2022-07-22 ENCOUNTER — Telehealth: Payer: Self-pay

## 2022-07-22 ENCOUNTER — Other Ambulatory Visit: Payer: Self-pay

## 2022-07-22 NOTE — Telephone Encounter (Signed)
A prior authorization for Ozempic has been approved until 07/18/2023. [facility] has been notified and successfully processed the prescription through insurance for $24.99. MyChart message sent to patient today.

## 2022-07-24 ENCOUNTER — Encounter: Payer: Self-pay | Admitting: Family Medicine

## 2022-07-31 ENCOUNTER — Other Ambulatory Visit: Payer: Self-pay | Admitting: Family Medicine

## 2022-07-31 DIAGNOSIS — Z9109 Other allergy status, other than to drugs and biological substances: Secondary | ICD-10-CM

## 2022-08-04 ENCOUNTER — Other Ambulatory Visit: Payer: Self-pay | Admitting: Family Medicine

## 2022-08-04 DIAGNOSIS — L309 Dermatitis, unspecified: Secondary | ICD-10-CM

## 2022-08-05 NOTE — Telephone Encounter (Signed)
Please advise Kh 

## 2022-08-07 LAB — COLOGUARD

## 2022-08-12 ENCOUNTER — Other Ambulatory Visit: Payer: Self-pay | Admitting: Family Medicine

## 2022-08-12 DIAGNOSIS — I10 Essential (primary) hypertension: Secondary | ICD-10-CM

## 2022-08-25 ENCOUNTER — Other Ambulatory Visit: Payer: Self-pay | Admitting: Family Medicine

## 2022-08-25 DIAGNOSIS — L309 Dermatitis, unspecified: Secondary | ICD-10-CM

## 2022-08-26 NOTE — Telephone Encounter (Signed)
Pleas advise Kh

## 2022-09-05 ENCOUNTER — Telehealth: Payer: Self-pay | Admitting: Family Medicine

## 2022-09-05 DIAGNOSIS — R195 Other fecal abnormalities: Secondary | ICD-10-CM

## 2022-09-05 NOTE — Telephone Encounter (Signed)
Pt was advised by provider. Kh

## 2022-09-05 NOTE — Telephone Encounter (Signed)
Pt asked for her colorgaurd results to be sent to my chart

## 2022-09-05 NOTE — Telephone Encounter (Signed)
Brenda Francis is a 62 year old female with back return to Cologuard.  Test yielded positive results.  Will send referral to gastroenterology for routine colonoscopy.  Orders Placed This Encounter  Procedures   Ambulatory referral to Gastroenterology    Referral Priority:   Urgent    Referral Type:   Consultation    Referral Reason:   Specialty Services Required    Number of Visits Requested:   1   [name]  APRN, MSN, FNP-C Patient Care [facility] [address] [place], [place] [address] [phone]

## 2022-09-10 ENCOUNTER — Other Ambulatory Visit: Payer: Self-pay | Admitting: Family Medicine

## 2022-09-10 DIAGNOSIS — Z9109 Other allergy status, other than to drugs and biological substances: Secondary | ICD-10-CM

## 2022-09-18 ENCOUNTER — Other Ambulatory Visit: Payer: Self-pay | Admitting: Family Medicine

## 2022-09-18 DIAGNOSIS — L309 Dermatitis, unspecified: Secondary | ICD-10-CM

## 2022-10-08 ENCOUNTER — Encounter: Payer: Self-pay | Admitting: Internal Medicine

## 2022-10-15 ENCOUNTER — Ambulatory Visit: Payer: BLUE CROSS/BLUE SHIELD | Admitting: Family Medicine

## 2022-10-15 ENCOUNTER — Encounter: Payer: Self-pay | Admitting: Family Medicine

## 2022-10-15 VITALS — BP 120/56 | HR 73 | Temp 97.1°F | Wt 188.6 lb

## 2022-10-15 DIAGNOSIS — L309 Dermatitis, unspecified: Secondary | ICD-10-CM | POA: Diagnosis not present

## 2022-10-15 DIAGNOSIS — E1165 Type 2 diabetes mellitus with hyperglycemia: Secondary | ICD-10-CM

## 2022-10-15 DIAGNOSIS — I10 Essential (primary) hypertension: Secondary | ICD-10-CM

## 2022-10-15 LAB — POCT GLYCOSYLATED HEMOGLOBIN (HGB A1C): Hemoglobin A1C: 5.8 % — AB (ref 4.0–5.6)

## 2022-10-15 MED ORDER — FLUOCINONIDE 0.05 % EX CREA
TOPICAL_CREAM | CUTANEOUS | 0 refills | Status: DC
Start: 2022-10-15 — End: 2022-11-11

## 2022-10-15 NOTE — Progress Notes (Signed)
Subjective   Patient ID: Brenda Francis, female    DOB: [birth year]  Age: 62 y.o. MRN: [id number]  Chief Complaint  Patient presents with  . Diabetes    Follow up    Brenda Francis is a 62 year old female with a medical history significant for essential hypertension, type 2 diabetes mellitus, and hyperlipidemia presents for a follow up of chronic conditions.    Diabetes She presents for her follow-up diabetic visit. She has type 2 diabetes mellitus. Her disease course has been stable. Pertinent negatives for hypoglycemia include no headaches or sweats. Pertinent negatives for diabetes include no blurred vision, no chest pain, no fatigue, no foot ulcerations, no polydipsia, no polyphagia, no visual change, no weakness and no weight loss. She is compliant with treatment most of the time. She does not see a podiatrist.Eye exam is current.  Hyperlipidemia This is a chronic problem. The problem is controlled. Exacerbating diseases include diabetes and obesity. She has no history of chronic renal disease, hypothyroidism or liver disease. There are no known factors aggravating her hyperlipidemia. Pertinent negatives include no chest pain or shortness of breath.  Hypertension This is a chronic problem. Pertinent negatives include no anxiety, blurred vision, chest pain, headaches, malaise/fatigue, neck pain, orthopnea, palpitations, peripheral edema, PND, shortness of breath or sweats. Risk factors for coronary artery disease include dyslipidemia. There is no history of kidney disease. There is no history of chronic renal disease.    Patient Active Problem List   Diagnosis Date Noted  . Type 2 diabetes mellitus with hyperglycemia, without long-term current use of insulin (HCC) 12/08/2019  . Obesity (BMI 30-39.9) 06/21/2019  . Eczema 06/21/2019  . Tobacco dependence 06/21/2019  . Former light cigarette smoker (1-9 per day) 12/08/2018  . Essential hypertension 05/05/2017  . Need for hepatitis C  screening test 05/05/2017  . Hyperlipidemia LDL goal <70 05/05/2017   Past Medical History:  Diagnosis Date  . Essential hypertension   . Tobacco dependence due to cigarettes    No past surgical history on file. Family History  Problem Relation Age of Onset  . Cancer Mother   . Diabetes Father       Review of Systems  Constitutional:  Negative for fatigue, malaise/fatigue and weight loss.  Eyes:  Negative for blurred vision.  Respiratory:  Negative for shortness of breath.   Cardiovascular:  Negative for chest pain, palpitations, orthopnea and PND.  Musculoskeletal:  Negative for neck pain.  Neurological:  Negative for weakness and headaches.  Endo/Heme/Allergies:  Negative for polydipsia and polyphagia.  Psychiatric/Behavioral: Negative.        Objective:     BP (!) 120/56   Pulse 73   Temp (!) 97.1 F (36.2 C)   Wt 188 lb 9.6 oz (85.5 kg)   SpO2 100%   BMI 32.37 kg/m  BP Readings from Last 3 Encounters:  10/15/22 (!) 120/56  07/18/22 137/60  01/15/22 (!) 125/56   Wt Readings from Last 3 Encounters:  10/15/22 188 lb 9.6 oz (85.5 kg)  07/18/22 194 lb (88 kg)  01/15/22 186 lb (84.4 kg)    Physical Exam Constitutional:      Appearance: Normal appearance.  Eyes:     Pupils: Pupils are equal, round, and reactive to light.  Cardiovascular:     Rate and Rhythm: Normal rate and regular rhythm.     Pulses: Normal pulses.  Pulmonary:     Effort: Pulmonary effort is normal.  Abdominal:     General: Bowel  sounds are normal.  Musculoskeletal:        General: Normal range of motion.  Skin:    General: Skin is warm.  Neurological:     General: No focal deficit present.     Mental Status: She is alert. Mental status is at baseline.  Psychiatric:        Mood and Affect: Mood normal.        Behavior: Behavior normal.        Thought Content: Thought content normal.        Judgment: Judgment normal.     Results for orders placed or performed in visit on  10/15/22  POCT glycosylated hemoglobin (Hb A1C)  Result Value Ref Range   Hemoglobin A1C 5.8 (A) 4.0 - 5.6 %   HbA1c POC (<> result, manual entry)     HbA1c, POC (prediabetic range)     HbA1c, POC (controlled diabetic range)      Last CBC Lab Results  Component Value Date   WBC 4.7 08/14/2017   HGB 13.0 08/14/2017   HCT 39.1 08/14/2017   MCV 86 08/14/2017   MCH 28.6 08/14/2017   RDW 13.3 08/14/2017   PLT 313 08/14/2017   Last metabolic panel Lab Results  Component Value Date   GLUCOSE 151 (H) 07/18/2022   NA 140 07/18/2022   K 3.6 07/18/2022   CL 101 07/18/2022   CO2 24 07/18/2022   BUN 16 07/18/2022   CREATININE 0.99 07/18/2022   EGFR 66 07/18/2022   CALCIUM 10.3 (H) 07/18/2022   PROT 7.2 07/18/2022   ALBUMIN 4.2 07/18/2022   LABGLOB 3.0 07/18/2022   AGRATIO 1.4 07/18/2022   BILITOT 0.4 07/18/2022   ALKPHOS 74 07/18/2022   AST 14 07/18/2022   ALT 16 07/18/2022   Last lipids Lab Results  Component Value Date   CHOL 171 07/10/2021   HDL 58 07/10/2021   LDLCALC 97 07/10/2021   TRIG 88 07/10/2021   CHOLHDL 2.9 07/10/2021   Last hemoglobin A1c Lab Results  Component Value Date   HGBA1C 5.8 (A) 10/15/2022   Last thyroid functions Lab Results  Component Value Date   TSH 1.750 08/14/2017   Last vitamin D No results found for: "25OHVITD2", "25OHVITD3", "VD25OH" Last vitamin B12 and Folate No results found for: "VITAMINB12", "FOLATE"    The 10-year ASCVD risk score (Arnett DK, et al., 2019) is: 10.3%    Assessment & Plan:   Problem List Items Addressed This Visit       Cardiovascular and Mediastinum   Essential hypertension   Relevant Orders   Basic Metabolic Panel     Endocrine   Type 2 diabetes mellitus with hyperglycemia, without long-term current use of insulin (HCC) - Primary   Relevant Orders   POCT glycosylated hemoglobin (Hb A1C) (Completed)   Basic Metabolic Panel     Musculoskeletal and Integument   Eczema   Relevant Medications    fluocinonide cream (LIDEX) 0.05 %   Return in about 3 months (around 01/14/2023) for sickle cell anemia, hypertension, diabetes.   1. Type 2 diabetes mellitus with hyperglycemia, without long-term current use of insulin (HCC)  - POCT glycosylated hemoglobin (Hb A1C) - Basic Metabolic Panel  2. Eczema, unspecified type  - fluocinonide cream (LIDEX) 0.05 %; APPLY  CREAM EXTERNALLY TO AFFECTED AREA TWICE DAILY AS NEEDED  Dispense: 60 g; Refill: 0  3. Essential hypertension BP (!) 120/56   Pulse 73   Temp (!) 97.1 F (36.2 C)   Wt  188 lb 9.6 oz (85.5 kg)   SpO2 100%   BMI 32.37 kg/m  - Continue medication, monitor blood pressure at home. Continue DASH diet.  Reminder to go to the ER if any CP, SOB, nausea, dizziness, severe HA, changes vision/speech, left arm numbness and tingling and jaw pain.   - Basic Metabolic Panel  Nolon Nations  APRN, MSN, FNP- Patient Care [facility] [address] [place], [place] [address] [phone]

## 2022-10-16 LAB — BASIC METABOLIC PANEL
BUN/Creatinine Ratio: 19 (ref 9–23)
BUN: 18 mg/dL (ref 6–24)
CO2: 21 mmol/L (ref 20–29)
Calcium: 10.3 mg/dL — ABNORMAL HIGH (ref 8.7–10.2)
Chloride: 100 mmol/L (ref 96–106)
Creatinine, Ser: 0.97 mg/dL (ref 0.57–1.00)
Glucose: 109 mg/dL — ABNORMAL HIGH (ref 70–99)
Potassium: 3.8 mmol/L (ref 3.5–5.2)
Sodium: 139 mmol/L (ref 134–144)
eGFR: 67 mL/min/{1.73_m2} (ref >59)

## 2022-10-26 ENCOUNTER — Other Ambulatory Visit: Payer: Self-pay | Admitting: Family Medicine

## 2022-10-26 DIAGNOSIS — E785 Hyperlipidemia, unspecified: Secondary | ICD-10-CM

## 2022-11-09 ENCOUNTER — Other Ambulatory Visit: Payer: Self-pay | Admitting: Family Medicine

## 2022-11-09 DIAGNOSIS — I10 Essential (primary) hypertension: Secondary | ICD-10-CM

## 2022-11-11 ENCOUNTER — Other Ambulatory Visit: Payer: Self-pay | Admitting: Family Medicine

## 2022-11-11 DIAGNOSIS — L309 Dermatitis, unspecified: Secondary | ICD-10-CM

## 2022-11-20 ENCOUNTER — Encounter: Payer: BLUE CROSS/BLUE SHIELD | Admitting: Internal Medicine

## 2022-12-08 ENCOUNTER — Other Ambulatory Visit: Payer: Self-pay | Admitting: Family Medicine

## 2022-12-08 DIAGNOSIS — L309 Dermatitis, unspecified: Secondary | ICD-10-CM

## 2022-12-09 NOTE — Telephone Encounter (Signed)
Please advise Kh

## 2022-12-26 ENCOUNTER — Other Ambulatory Visit: Payer: Self-pay | Admitting: Family Medicine

## 2022-12-26 DIAGNOSIS — E785 Hyperlipidemia, unspecified: Secondary | ICD-10-CM

## 2023-01-01 ENCOUNTER — Ambulatory Visit: Payer: Self-pay | Admitting: Family Medicine

## 2023-01-01 NOTE — Telephone Encounter (Signed)
Copied from CRM [id number]. Topic: Clinical - Red Word Triage >> Jan 01, 2023 11:17 AM Brenda Francis H wrote: Red Word that prompted transfer to Nurse Triage: Pt states she is having right side pain, nausea and had blood in urine. States she thinks it may a side effect of Ozempic  Chief Complaint: Right sided abdominal pain Symptoms: off and on right sided abdominal pain low near the pelvic bone area, some nausea, urine was discolored but has cleared up per patient Frequency: x 1 week off and on Pertinent Negatives: Patient denies vomiting, diarrhea, fever Disposition: [] ED /[x] Urgent Care (no appt availability in office) / [] Appointment(In office/virtual)/ []  [facility]/ [] Home Care/ [] Refused Recommended Disposition /[]  Mobile Bus/ []  Follow-up with PCP Additional Notes: Patient advised that she has been having off and on lower right sided pain near the top of her pelvic bone.  Patient endorses some nausea but denies vomiting.  She advised that she has some discoloration of her urine recently but it has since cleared up.  She also denies and fevers or diarrhea.  She states that she didn't know if her Ozempic might be causing her symptoms.  Patient is advised to go to urgent care due to no availability today in her PCP office. Patient is also given home care advice on staying hydrated and eating bland foods.  Patient did want to be scheduled for an in office visit Tuesday 01/07/23 at 8:40am and advised that if she felt like she was getting any worse she would go to the urgent care. It was emphasized that this RN thought patient should be seen at urgent care today due to her symptoms and pt states that if she feels any worse she will go there.  Patient is also advised to either call us back, go to the urgent care, or go to the emergency room if her symptoms got worse.  Patient verbalizes understanding of all instructions.  Reason for Disposition  [1] MODERATE pain (e.g., interferes with  normal activities) AND [2] pain comes and goes (cramps) AND [3] present > 24 hours  (Exception: Pain with Vomiting or Diarrhea - see that Guideline.)  Answer Assessment - Initial Assessment Questions 1. LOCATION: "Where does it hurt?"      Right side of abdomen--lower near the top of the pelvic bone 2. RADIATION: "Does the pain shoot anywhere else?" (e.g., chest, back)     No 3. ONSET: "When did the pain begin?" (e.g., minutes, hours or days ago)      1 week ago 4. SUDDEN: "Gradual or sudden onset?"     X 1 week 5. PATTERN "Does the pain come and go, or is it constant?"    - If it comes and goes: "How long does it last?" "Do you have pain now?"     (Note: Comes and goes means the pain is intermittent. It goes away completely between bouts.)    - If constant: "Is it getting better, staying the same, or getting worse?"      (Note: Constant means the pain never goes away completely; most serious pain is constant and gets worse.)      Coming and on 6. SEVERITY: "How bad is the pain?"  (e.g., Scale 1-10; mild, moderate, or severe)    - MILD (1-3): Doesn't interfere with normal activities, abdomen soft and not tender to touch.     - MODERATE (4-7): Interferes with normal activities or awakens from sleep, abdomen tender to touch.     -  SEVERE (8-10): Excruciating pain, doubled over, unable to do any normal activities.       "Not able to sleep at night, tossing and turning? 7. RECURRENT SYMPTOM: "Have you ever had this type of stomach pain before?" If Yes, ask: "When was the last time?" and "What happened that time?"      No 8. CAUSE: "What do you think is causing the stomach pain?"     Pt is wondering if her Ozempic may be causing this 9. RELIEVING/AGGRAVATING FACTORS: "What makes it better or worse?" (e.g., antacids, bending or twisting motion, bowel movement)     Nothing really changes it 10. OTHER SYMPTOMS: "Do you have any other symptoms?" (e.g., back pain, diarrhea, fever, urination pain,  vomiting)       "Urine was slightly discolored but has cleared up now" 11. PREGNANCY: "Is there any chance you are pregnant?" "When was your last menstrual period?"       N/A  Protocols used: Abdominal Pain - [facility]

## 2023-01-01 NOTE — Telephone Encounter (Signed)
Was sent to provider. KH

## 2023-01-07 ENCOUNTER — Ambulatory Visit (INDEPENDENT_AMBULATORY_CARE_PROVIDER_SITE_OTHER): Payer: BLUE CROSS/BLUE SHIELD | Admitting: Nurse Practitioner

## 2023-01-07 ENCOUNTER — Encounter: Payer: Self-pay | Admitting: Nurse Practitioner

## 2023-01-07 VITALS — BP 117/56 | HR 66 | Temp 97.4°F | Wt 190.4 lb

## 2023-01-07 DIAGNOSIS — R110 Nausea: Secondary | ICD-10-CM | POA: Insufficient documentation

## 2023-01-07 DIAGNOSIS — R319 Hematuria, unspecified: Secondary | ICD-10-CM | POA: Insufficient documentation

## 2023-01-07 DIAGNOSIS — M25551 Pain in right hip: Secondary | ICD-10-CM | POA: Diagnosis not present

## 2023-01-07 LAB — POCT URINALYSIS DIP (CLINITEK)
Bilirubin, UA: NEGATIVE
Glucose, UA: NEGATIVE mg/dL
Ketones, POC UA: NEGATIVE mg/dL
Leukocytes, UA: NEGATIVE
Nitrite, UA: NEGATIVE
POC PROTEIN,UA: NEGATIVE
Spec Grav, UA: 1.02 (ref 1.010–1.025)
Urobilinogen, UA: 0.2 U/dL
pH, UA: 6 (ref 5.0–8.0)

## 2023-01-07 MED ORDER — METHYLPREDNISOLONE NA SUC (PF) 40 MG IJ SOLR
40.0000 mg | Freq: Once | INTRAMUSCULAR | Status: AC
  Administered 2023-01-07: 40 mg via INTRAMUSCULAR

## 2023-01-07 MED ORDER — KETOROLAC TROMETHAMINE 30 MG/ML IJ SOLN
30.0000 mg | Freq: Once | INTRAMUSCULAR | Status: AC
  Administered 2023-01-07: 30 mg via INTRAMUSCULAR

## 2023-01-07 MED ORDER — ONDANSETRON HCL 4 MG PO TABS: 4.0000 mg | ORAL_TABLET | Freq: Three times a day (TID) | ORAL | 0 refills | Status: AC | PRN

## 2023-01-07 MED ORDER — IBUPROFEN 600 MG PO TABS: 600.0000 mg | ORAL_TABLET | Freq: Three times a day (TID) | ORAL | 0 refills | Status: DC | PRN

## 2023-01-07 NOTE — Assessment & Plan Note (Signed)
1. Pain of right hip  - ibuprofen (ADVIL) 600 MG tablet; Take 1 tablet (600 mg total) by mouth every 8 (eight) hours as needed.  Dispense: 30 tablet; Refill: 0 - ketorolac (TORADOL) 30 MG/ML injection 30 mg - methylPREDNISolone sodium succinate (SOLU-MEDROL) 40 MG injection 40 mg Patient told to take ibuprofen with food to help prevent GI side effects.  Alternate ibuprofen with Tylenol 650 mg every 6 hours as needed.  Application of heat, ice stretching exercises encouraged.  Call the office in 2 weeks if symptoms has not resolved will consider an x-ray of the hip

## 2023-01-07 NOTE — Patient Instructions (Addendum)
You were given Toradol 30 mg intramuscular injection and Solu-Medrol 40 mg injection in the office today Please take ibuprofen 600 mg every 8 hours as needed alternate with Tylenol 650 mg every 6 hours as needed .  I also encourage application of heating pad or ice, stretch and exercises is also helpful.   1. Pain of right hip  - ibuprofen (ADVIL) 600 MG tablet; Take 1 tablet (600 mg total) by mouth every 8 (eight) hours as needed.  Dispense: 30 tablet; Refill: 0  2. Hematuria, unspecified type   3. Nausea  - ondansetron (ZOFRAN) 4 MG tablet; Take 1 tablet (4 mg total) by mouth every 8 (eight) hours as needed for nausea or vomiting.  Dispense: 20 tablet; Refill: 0       It is important that you exercise regularly at least 30 minutes 5 times a week as tolerated  Think about what you will eat, plan ahead. Choose " clean, green, fresh or frozen" over canned, processed or packaged foods which are more sugary, salty and fatty. 70 to 75% of food eaten should be vegetables and fruit. Three meals at set times with snacks allowed between meals, but they must be fruit or vegetables. Aim to eat over a 12 hour period , example 7 am to 7 pm, and STOP after  your last meal of the day. Drink water,generally about 64 ounces per day, no other drink is as healthy. Fruit juice is best enjoyed in a healthy way, by EATING the fruit.  Thanks for choosing [facility] we consider it a privelige to serve you.

## 2023-01-07 NOTE — Assessment & Plan Note (Signed)
Lab Results  Component Value Date   COLORU yellow 01/07/2023   CLARITYU clear 01/07/2023   GLUCOSEUR negative 01/07/2023   BILIRUBINUR negative 01/07/2023   KETONESU trace 06/06/2020   SPECGRAV 1.020 01/07/2023   RBCUR small (A) 01/07/2023   PHUR 6.0 01/07/2023   PROTEINUR Negative 06/06/2020   UROBILINOGEN 0.2 01/07/2023   LEUKOCYTESUR Negative 01/07/2023  UA negative for UTI

## 2023-01-07 NOTE — Assessment & Plan Note (Signed)
She is currently on Ozempic for type 2 diabetes but she has been on the medication for months. Encouraged to avoid fatty fried foods eat smaller portions of meals to prevent nausea Zofran 4 mg every 8 hours as needed ordered

## 2023-01-07 NOTE — Progress Notes (Signed)
Acute Office Visit  Subjective:     Patient ID: Brenda Francis, female    DOB: 1960-09-03, 62 y.o.   MRN: [id number]  Chief Complaint  Patient presents with   Back Pain    Right side    Hip Pain    Right side    HPI  has a past medical history of Eczema (06/21/2019), Essential hypertension, Tobacco dependence due to cigarettes, and Type 2 diabetes mellitus with hyperglycemia, without long-term current use of insulin (HCC) (12/08/2019).  Patient presents with complaints of right hip pain that started suddenly 2 weeks ago, she has been doing stretching exercises and  having to change positions frequently at night due to pain , currently has aching pain rated 7/10, she has been taking ibuprofen as needed, stated that ibuprofen really help relieve her pain.  Ibuprofen last taken yesterday morning. she denies fever, chills, numbness, tingling,   Patient complains of hematuria that occurred last Monday, no more hematuria since last Monday.  She denies fever, chills, abdominal pain, vomiting, pelvic pain, dysuria has some intermittent nausea  Positive Cologuard test ,she has upcoming colonoscopy in December      Review of Systems  Constitutional:  Negative for appetite change, chills, fatigue and fever.  HENT:  Negative for congestion, postnasal drip, rhinorrhea and sneezing.   Respiratory:  Negative for cough, shortness of breath and wheezing.   Cardiovascular:  Negative for chest pain, palpitations and leg swelling.  Gastrointestinal:  Positive for nausea. Negative for abdominal pain, constipation and vomiting.  Genitourinary:  Positive for hematuria. Negative for difficulty urinating, dysuria, flank pain and frequency.  Musculoskeletal:  Positive for arthralgias. Negative for back pain, joint swelling and myalgias.  Skin:  Negative for color change, pallor, rash and wound.  Neurological:  Negative for dizziness, facial asymmetry, weakness, numbness and headaches.   Psychiatric/Behavioral:  Negative for behavioral problems, confusion, self-injury and suicidal ideas.         Objective:    BP (!) 117/56   Pulse 66   Temp (!) 97.4 F (36.3 C)   Wt 190 lb 6.4 oz (86.4 kg)   SpO2 99%   BMI 32.68 kg/m    Physical Exam Vitals and nursing note reviewed.  Constitutional:      General: She is not in acute distress.    Appearance: Normal appearance. She is obese. She is not ill-appearing, toxic-appearing or diaphoretic.  HENT:     Mouth/Throat:     Mouth: Mucous membranes are moist.     Pharynx: Oropharynx is clear. No oropharyngeal exudate or posterior oropharyngeal erythema.  Eyes:     General: No scleral icterus.       Right eye: No discharge.        Left eye: No discharge.     Extraocular Movements: Extraocular movements intact.     Conjunctiva/sclera: Conjunctivae normal.  Cardiovascular:     Rate and Rhythm: Normal rate and regular rhythm.     Pulses: Normal pulses.     Heart sounds: Normal heart sounds. No murmur heard.    No friction rub. No gallop.  Pulmonary:     Effort: Pulmonary effort is normal. No respiratory distress.     Breath sounds: Normal breath sounds. No stridor. No wheezing, rhonchi or rales.  Chest:     Chest wall: No tenderness.  Abdominal:     General: There is no distension.     Palpations: Abdomen is soft.     Tenderness: There is no abdominal tenderness.  There is no right CVA tenderness, left CVA tenderness or guarding.  Musculoskeletal:        General: Tenderness present. No swelling, deformity or signs of injury.     Right lower leg: No edema.     Left lower leg: No edema.     Comments: Tenderness on palpation of right hip, skin warm and dry, patient able to ambulate independently  Skin:    General: Skin is warm and dry.     Capillary Refill: Capillary refill takes less than 2 seconds.     Coloration: Skin is not jaundiced or pale.     Findings: No bruising, erythema or lesion.  Neurological:      Mental Status: She is alert and oriented to person, place, and time.     Motor: No weakness.     Coordination: Coordination normal.     Gait: Gait normal.  Psychiatric:        Mood and Affect: Mood normal.        Behavior: Behavior normal.        Thought Content: Thought content normal.        Judgment: Judgment normal.     Results for orders placed or performed in visit on 01/07/23  POCT URINALYSIS DIP (CLINITEK)  Result Value Ref Range   Color, UA yellow yellow   Clarity, UA clear clear   Glucose, UA negative negative mg/dL   Bilirubin, UA negative negative   Ketones, POC UA negative negative mg/dL   Spec Grav, UA 3.151 7.616 - 1.025   Blood, UA small (A) negative   pH, UA 6.0 5.0 - 8.0   POC PROTEIN,UA negative negative, trace   Urobilinogen, UA 0.2 0.2 or 1.0 E.U./dL   Nitrite, UA Negative Negative   Leukocytes, UA Negative Negative        Assessment & Plan:   Problem List Items Addressed This Visit       Other   Pain of right hip - Primary    1. Pain of right hip  - ibuprofen (ADVIL) 600 MG tablet; Take 1 tablet (600 mg total) by mouth every 8 (eight) hours as needed.  Dispense: 30 tablet; Refill: 0 - ketorolac (TORADOL) 30 MG/ML injection 30 mg - methylPREDNISolone sodium succinate (SOLU-MEDROL) 40 MG injection 40 mg Patient told to take ibuprofen with food to help prevent GI side effects.  Alternate ibuprofen with Tylenol 650 mg every 6 hours as needed.  Application of heat, ice stretching exercises encouraged.  Call the office in 2 weeks if symptoms has not resolved will consider an x-ray of the hip          Relevant Medications   ibuprofen (ADVIL) 600 MG tablet   Hematuria    Lab Results  Component Value Date   COLORU yellow 01/07/2023   CLARITYU clear 01/07/2023   GLUCOSEUR negative 01/07/2023   BILIRUBINUR negative 01/07/2023   KETONESU trace 06/06/2020   SPECGRAV 1.020 01/07/2023   RBCUR small (A) 01/07/2023   PHUR 6.0 01/07/2023   PROTEINUR  Negative 06/06/2020   UROBILINOGEN 0.2 01/07/2023   LEUKOCYTESUR Negative 01/07/2023  UA negative for UTI      Relevant Orders   POCT URINALYSIS DIP (CLINITEK) (Completed)   Nausea    She is currently on Ozempic for type 2 diabetes but she has been on the medication for months. Encouraged to avoid fatty fried foods eat smaller portions of meals to prevent nausea Zofran 4 mg every 8 hours as needed ordered  Relevant Medications   ondansetron (ZOFRAN) 4 MG tablet    Meds ordered this encounter  Medications   ondansetron (ZOFRAN) 4 MG tablet    Sig: Take 1 tablet (4 mg total) by mouth every 8 (eight) hours as needed for nausea or vomiting.    Dispense:  20 tablet    Refill:  0   ibuprofen (ADVIL) 600 MG tablet    Sig: Take 1 tablet (600 mg total) by mouth every 8 (eight) hours as needed.    Dispense:  30 tablet    Refill:  0   ketorolac (TORADOL) 30 MG/ML injection 30 mg   methylPREDNISolone sodium succinate (SOLU-MEDROL) 40 MG injection 40 mg    Return in about 4 months (around 05/08/2023) for CPE.  Donell Beers, FNP

## 2023-01-14 ENCOUNTER — Other Ambulatory Visit: Payer: Self-pay | Admitting: Family Medicine

## 2023-01-14 ENCOUNTER — Ambulatory Visit: Payer: Self-pay | Admitting: Family Medicine

## 2023-01-14 ENCOUNTER — Ambulatory Visit: Payer: Self-pay | Admitting: Nurse Practitioner

## 2023-01-14 DIAGNOSIS — Z9109 Other allergy status, other than to drugs and biological substances: Secondary | ICD-10-CM

## 2023-02-06 ENCOUNTER — Other Ambulatory Visit: Payer: Self-pay | Admitting: Family Medicine

## 2023-02-06 DIAGNOSIS — I10 Essential (primary) hypertension: Secondary | ICD-10-CM

## 2023-02-13 ENCOUNTER — Other Ambulatory Visit: Payer: Self-pay | Admitting: Family Medicine

## 2023-02-13 DIAGNOSIS — Z9109 Other allergy status, other than to drugs and biological substances: Secondary | ICD-10-CM

## 2023-02-23 ENCOUNTER — Other Ambulatory Visit: Payer: Self-pay | Admitting: Family Medicine

## 2023-02-23 DIAGNOSIS — E785 Hyperlipidemia, unspecified: Secondary | ICD-10-CM

## 2023-02-23 DIAGNOSIS — Z9109 Other allergy status, other than to drugs and biological substances: Secondary | ICD-10-CM

## 2023-02-26 ENCOUNTER — Other Ambulatory Visit: Payer: Self-pay | Admitting: Family Medicine

## 2023-02-26 DIAGNOSIS — E785 Hyperlipidemia, unspecified: Secondary | ICD-10-CM

## 2023-04-08 ENCOUNTER — Other Ambulatory Visit: Payer: Self-pay | Admitting: Family Medicine

## 2023-04-08 DIAGNOSIS — E1165 Type 2 diabetes mellitus with hyperglycemia: Secondary | ICD-10-CM

## 2023-04-15 ENCOUNTER — Other Ambulatory Visit: Payer: Self-pay

## 2023-04-15 NOTE — Telephone Encounter (Unsigned)
 Copied from CRM [id number]. Topic: Clinical - Prescription Issue >> Apr 15, 2023 12:29 PM Martha Clan wrote: Reason for CRM: OZEMPIC, 0.25 OR 0.5 MG/DOSE, 2 MG/3ML SOPN [[id number]] Patient changed insurances recently and pharmacy is asking for another authorization.

## 2023-04-22 ENCOUNTER — Telehealth: Payer: Self-pay | Admitting: Family Medicine

## 2023-04-22 ENCOUNTER — Other Ambulatory Visit: Payer: Self-pay | Admitting: Family Medicine

## 2023-04-22 DIAGNOSIS — E1165 Type 2 diabetes mellitus with hyperglycemia: Secondary | ICD-10-CM

## 2023-04-22 NOTE — Telephone Encounter (Signed)
 Copied from CRM [id number]. Topic: General - Call Back - No Documentation >> Apr 22, 2023  9:52 AM Higinio Roger wrote: Reason for CRM: Patient is requesting a callback to see if the clinic has any ozempic samples until her prior authorization is approved.  Callback #: [phone]

## 2023-04-22 NOTE — Telephone Encounter (Signed)
 Pt will be seen tomorrow and sample of ozempic in the fridge. KH

## 2023-04-22 NOTE — Telephone Encounter (Signed)
 Copied from CRM [id number]. Topic: Clinical - Medication Refill >> Apr 22, 2023  9:49 AM Higinio Roger wrote: Most Recent Primary Care Visit:  Provider: Donell Beers  Department: [facility]  Visit Type: ACUTE  Date: 01/07/2023  Medication: OZEMPIC, 0.25 OR 0.5 MG/DOSE, 2 MG/3ML SOPN  Has the patient contacted their pharmacy? Yes (Agent: If no, request that the patient contact the pharmacy for the refill. If patient does not wish to contact the pharmacy document the reason why and proceed with request.) (Agent: If yes, when and what did the pharmacy advise?) Needs prior authorization  Is this the correct pharmacy for this prescription? Yes If no, delete pharmacy and type the correct one.  This is the patient's preferred pharmacy:   [facility] [address], [place] - [address] [place] [address] Phone: [phone] Fax: [phone]  Has the prescription been filled recently? Yes  Is the patient out of the medication? Yes  Has the patient been seen for an appointment in the last year OR does the patient have an upcoming appointment? Yes  Can we respond through MyChart? Yes  Agent: Please be advised that Rx refills may take up to 3 business days. We ask that you follow-up with your pharmacy.

## 2023-04-23 ENCOUNTER — Ambulatory Visit (INDEPENDENT_AMBULATORY_CARE_PROVIDER_SITE_OTHER): Payer: Self-pay | Admitting: Nurse Practitioner

## 2023-04-23 ENCOUNTER — Encounter: Payer: Self-pay | Admitting: Nurse Practitioner

## 2023-04-23 VITALS — BP 116/50 | Temp 97.0°F | Wt 185.6 lb

## 2023-04-23 DIAGNOSIS — Z1321 Encounter for screening for nutritional disorder: Secondary | ICD-10-CM

## 2023-04-23 DIAGNOSIS — Z130 Encounter for screening for diseases of the blood and blood-forming organs and certain disorders involving the immune mechanism: Secondary | ICD-10-CM

## 2023-04-23 DIAGNOSIS — I10 Essential (primary) hypertension: Secondary | ICD-10-CM

## 2023-04-23 DIAGNOSIS — E1165 Type 2 diabetes mellitus with hyperglycemia: Secondary | ICD-10-CM | POA: Diagnosis not present

## 2023-04-23 DIAGNOSIS — Z1231 Encounter for screening mammogram for malignant neoplasm of breast: Secondary | ICD-10-CM

## 2023-04-23 DIAGNOSIS — E785 Hyperlipidemia, unspecified: Secondary | ICD-10-CM

## 2023-04-23 DIAGNOSIS — E669 Obesity, unspecified: Secondary | ICD-10-CM

## 2023-04-23 DIAGNOSIS — Z1329 Encounter for screening for other suspected endocrine disorder: Secondary | ICD-10-CM

## 2023-04-23 DIAGNOSIS — Z0000 Encounter for general adult medical examination without abnormal findings: Secondary | ICD-10-CM | POA: Insufficient documentation

## 2023-04-23 DIAGNOSIS — H6123 Impacted cerumen, bilateral: Secondary | ICD-10-CM

## 2023-04-23 DIAGNOSIS — Z13228 Encounter for screening for other metabolic disorders: Secondary | ICD-10-CM

## 2023-04-23 LAB — POCT GLYCOSYLATED HEMOGLOBIN (HGB A1C): Hemoglobin A1C: 5.5 % (ref 4.0–5.6)

## 2023-04-23 MED ORDER — DEBROX 6.5 % OT SOLN: 5.0000 [drp] | Freq: Two times a day (BID) | OTIC | 0 refills | Status: AC

## 2023-04-23 MED ORDER — LANCETS MISC. MISC: 1.0000 | Freq: Three times a day (TID) | 0 refills | Status: AC

## 2023-04-23 MED ORDER — BLOOD GLUCOSE MONITORING SUPPL DEVI: 1.0000 | Freq: Three times a day (TID) | 0 refills | Status: DC

## 2023-04-23 MED ORDER — OZEMPIC (0.25 OR 0.5 MG/DOSE) 2 MG/3ML ~~LOC~~ SOPN: 0.2500 mg | PEN_INJECTOR | SUBCUTANEOUS | 1 refills | Status: DC

## 2023-04-23 MED ORDER — LANCET DEVICE MISC: 1.0000 | Freq: Three times a day (TID) | 0 refills | Status: AC

## 2023-04-23 MED ORDER — BLOOD GLUCOSE TEST VI STRP: 1.0000 | ORAL_STRIP | Freq: Three times a day (TID) | 0 refills | Status: AC

## 2023-04-23 NOTE — Assessment & Plan Note (Signed)
 Continue hydrochlorothiazide 25 mg daily DASH diet and commitment to daily physical activity for a minimum of 30 minutes discussed and encouraged, as a part of hypertension management. The importance of attaining a healthy weight is also discussed.     04/23/2023    8:14 AM 01/07/2023    9:03 AM 01/07/2023    8:23 AM 10/15/2022    8:46 AM 07/18/2022    8:49 AM 01/15/2022    8:57 AM 07/10/2021    9:53 AM  BP/Weight  Systolic BP 116 117 137 120 137 125 162  Diastolic BP 50 56 49 56 60 56 88  Wt. (Lbs) 185.6  190.4 188.6 194 186   BMI 31.86 kg/m2  32.68 kg/m2 32.37 kg/m2 33.3 kg/m2 31.93 kg/m2

## 2023-04-23 NOTE — Assessment & Plan Note (Signed)
 Lab Results  Component Value Date   HGBA1C 5.5 04/23/2023  Currently well-controlled on Ozempic 0.25 mg once weekly Continue current medication Sample given in the office today Patient counseled on low-carb diet Will reach out to the pharmacy to see if there is a need to submit a prior authorization for Ozempic

## 2023-04-23 NOTE — Assessment & Plan Note (Signed)
 Lab Results  Component Value Date   CHOL 171 07/10/2021   HDL 58 07/10/2021   LDLCALC 97 07/10/2021   TRIG 88 07/10/2021   CHOLHDL 2.9 07/10/2021  Check lipid panel currently on atorvastatin 20 mg daily LDL goal is less than 70

## 2023-04-23 NOTE — Patient Instructions (Addendum)
 Please consider getting Shingrix, pneumococcal and Tdap vaccine at local pharmacy.       Goal for fasting blood sugar ranges from 80 to 120 and 2 hours after any meal or at bedtime should be between 130 to 170.    Please call [phone]   to schedule your mammogram.  [facility]. [address] [place], [place] [address]. [place].     1. Type 2 diabetes mellitus with hyperglycemia, without long-term current use of insulin (HCC) (Primary)  - POCT glycosylated hemoglobin (Hb A1C) - Blood Glucose Monitoring Suppl DEVI; 1 each by Does not apply route in the morning, at noon, and at bedtime. May substitute to any manufacturer covered by patient's insurance.  Dispense: 1 each; Refill: 0 - Glucose Blood (BLOOD GLUCOSE TEST STRIPS) STRP; 1 each by In Vitro route in the morning, at noon, and at bedtime. May substitute to any manufacturer covered by patient's insurance.  Dispense: 100 strip; Refill: 0 - Lancet Device MISC; 1 each by Does not apply route in the morning, at noon, and at bedtime. May substitute to any manufacturer covered by patient's insurance.  Dispense: 1 each; Refill: 0 - Lancets Misc. MISC; 1 each by Does not apply route in the morning, at noon, and at bedtime. May substitute to any manufacturer covered by patient's insurance.  Dispense: 100 each; Refill: 0  2. Screening mammogram for breast cancer  - MM 3D SCREENING MAMMOGRAM BILATERAL BREAST; Future  3. Essential hypertension   4. Annual physical exam  5. Screening for endocrine, nutritional, metabolic and immunity disorder  - CBC - CMP14+EGFR - Lipid panel  6. Excessive ear wax, bilateral  - carbamide peroxide (DEBROX) 6.5 % OTIC solution; Place 5 drops into both ears 2 (two) times daily.  Dispense: 15 mL; Refill: 0   OK to use Debrox (peroxide) in the ear to loosen up wax. Also recommend using a bulb syringe (for removing boogers from baby's noses) to flush through warm  water and vinegar (3-4:1 ratio). An alternative, though more expensive, is an elephant ear washer wax removal kit. Do not use Q-tips as this can impact wax further.      It is important that you exercise regularly at least 30 minutes 5 times a week as tolerated  Think about what you will eat, plan ahead. Choose " clean, green, fresh or frozen" over canned, processed or packaged foods which are more sugary, salty and fatty. 70 to 75% of food eaten should be vegetables and fruit. Three meals at set times with snacks allowed between meals, but they must be fruit or vegetables. Aim to eat over a 12 hour period , example 7 am to 7 pm, and STOP after  your last meal of the day. Drink water,generally about 64 ounces per day, no other drink is as healthy. Fruit juice is best enjoyed in a healthy way, by EATING the fruit.  Thanks for choosing [facility] we consider it a privelige to serve you.

## 2023-04-23 NOTE — Assessment & Plan Note (Signed)
 Wt Readings from Last 3 Encounters:  04/23/23 185 lb 9.6 oz (84.2 kg)  01/07/23 190 lb 6.4 oz (86.4 kg)  10/15/22 188 lb 9.6 oz (85.5 kg)   Body mass index is 31.86 kg/m.  Patient counseled on low-carb diet Encouraged to engage in regular moderate exercises at least [place] 50 minutes weekly as tolerated On Ozempic for type 2 diabetes

## 2023-04-23 NOTE — Assessment & Plan Note (Signed)
 Annual exam as documented.  Counseling done include healthy lifestyle involving committing to 150 minutes of exercise per week, heart healthy diet, and attaining healthy weight. The importance of adequate sleep also discussed.  Regular use of seat belt and home safety were also discussed . Changes in health habits are decided on by patient with goals and time frames set for achieving them. Immunization and cancer screening  needs are specifically addressed at this visit.    Patient encouraged to get influenza vaccine, pneumococcal vaccine, Tdap vaccine and shingles vaccine Plans to get Pap smear at next visit Encouraged to schedule an appointment for colonoscopy Mammogram ordered

## 2023-04-23 NOTE — Progress Notes (Signed)
 Complete physical exam  Patient: Brenda Francis   DOB: 21-Dec-1960   63 y.o. Female  MRN: [id number]  Subjective:    Chief Complaint  Patient presents with   Diabetes   Annual Exam    Not fasting    Brenda Francis is a 63 y.o. female  has a past medical history of Eczema (06/21/2019), Essential hypertension, Tobacco dependence due to cigarettes, and Type 2 diabetes mellitus with hyperglycemia, without long-term current use of insulin (HCC) (12/08/2019). who presents today for a complete physical exam. She reports consuming a general diet. Does walking excercises 30 minutes daily .  She generally feels well. She reports sleeping well.   Type 2 diabetes.  Currently on Ozempic 0.25 mg once weekly, stated that she tried metformin in the past but had abdominal discomfort, diarrhea while taking metformin.  No complaints of polyuria polyphagia. She thinks that her [facility] may not be active because when she went to refil ozempic that Copay had gone up.  Takes atorvastatin 20 mg daily.  Has upcoming diabetic eye exam in April.   Had a positive Cologuard test, states that she will make an appointment for colonoscopy Mammogram ordered, she plans on getting the Pap smear at her next appointment.     Most recent fall risk assessment:    07/18/2022    8:54 AM  Fall Risk   Falls in the past year? 0  Number falls in past yr: 0  Injury with Fall? 0  Risk for fall due to : No Fall Risks  Follow up Falls evaluation completed     Most recent depression screenings:    04/23/2023    8:18 AM 10/15/2022    8:45 AM  PHQ 2/9 Scores  PHQ - 2 Score 0 0        Patient Care Team: Donell Beers, FNP as PCP - General (Nurse Practitioner)   Outpatient Medications Prior to Visit  Medication Sig Note   atorvastatin (LIPITOR) 20 MG tablet Take 1 tablet by mouth once daily    EQ ASPIRIN ADULT LOW DOSE 81 MG tablet TAKE 1 TABLET BY MOUTH ONCE DAILY SWALLOW  WHOLE    fluocinonide cream  (LIDEX) 0.05 % APPLY  CREAM EXTERNALLY TO AFFECTED AREA TWICE DAILY AS NEEDED    hydrochlorothiazide (HYDRODIURIL) 25 MG tablet Take 1 tablet by mouth once daily    ibuprofen (ADVIL) 600 MG tablet Take 1 tablet (600 mg total) by mouth every 8 (eight) hours as needed.    levocetirizine (XYZAL) 5 MG tablet TAKE 1 TABLET BY MOUTH ONCE DAILY IN THE EVENING    OZEMPIC, 0.25 OR 0.5 MG/DOSE, 2 MG/3ML SOPN INJECT 0.25MG  SUBCUTANEOUSLY ONCE A WEEK    metFORMIN (GLUCOPHAGE) 500 MG tablet Take 1 tablet (500 mg total) by mouth 2 (two) times daily with a meal. (Patient not taking: Reported on 04/23/2023)    ondansetron (ZOFRAN) 4 MG tablet Take 1 tablet (4 mg total) by mouth every 8 (eight) hours as needed for nausea or vomiting. (Patient not taking: Reported on 04/23/2023) 04/23/2023: Prn    No facility-administered medications prior to visit.    Review of Systems  Constitutional:  Negative for appetite change, chills, fatigue and fever.  HENT:  Negative for congestion, postnasal drip, rhinorrhea and sneezing.   Eyes:  Negative for pain, discharge and itching.  Respiratory:  Negative for cough, shortness of breath and wheezing.   Cardiovascular:  Negative for chest pain, palpitations and leg swelling.  Gastrointestinal:  Negative  for abdominal pain, constipation, nausea and vomiting.  Endocrine: Negative for cold intolerance, heat intolerance and polydipsia.  Genitourinary:  Negative for difficulty urinating, dysuria, flank pain and frequency.  Musculoskeletal:  Negative for arthralgias, back pain, joint swelling and myalgias.  Skin:  Negative for color change, pallor, rash and wound.  Allergic/Immunologic: Negative for immunocompromised state.  Neurological:  Negative for dizziness, facial asymmetry, weakness, numbness and headaches.  Psychiatric/Behavioral:  Negative for behavioral problems, confusion, self-injury and suicidal ideas.        Objective:     BP (!) 116/50   Temp (!) 97 F (36.1 C)    Wt 185 lb 9.6 oz (84.2 kg)   BMI 31.86 kg/m    Physical Exam Vitals and nursing note reviewed.  Constitutional:      General: She is not in acute distress.    Appearance: Normal appearance. She is obese. She is not ill-appearing, toxic-appearing or diaphoretic.  HENT:     Mouth/Throat:     Mouth: Mucous membranes are moist.     Pharynx: Oropharynx is clear. No oropharyngeal exudate or posterior oropharyngeal erythema.  Eyes:     General: No scleral icterus.       Right eye: No discharge.        Left eye: No discharge.     Extraocular Movements: Extraocular movements intact.     Conjunctiva/sclera: Conjunctivae normal.  Cardiovascular:     Rate and Rhythm: Normal rate and regular rhythm.     Pulses: Normal pulses.     Heart sounds: Normal heart sounds. No murmur heard.    No friction rub. No gallop.  Pulmonary:     Effort: Pulmonary effort is normal. No respiratory distress.     Breath sounds: Normal breath sounds. No stridor. No wheezing, rhonchi or rales.  Chest:     Chest wall: No tenderness.  Abdominal:     General: There is no distension.     Palpations: Abdomen is soft.     Tenderness: There is no abdominal tenderness. There is no right CVA tenderness, left CVA tenderness or guarding.  Musculoskeletal:        General: No swelling, tenderness, deformity or signs of injury.     Right lower leg: No edema.     Left lower leg: No edema.  Skin:    General: Skin is warm and dry.     Capillary Refill: Capillary refill takes 2 to 3 seconds.     Coloration: Skin is not jaundiced or pale.     Findings: No bruising, erythema or lesion.  Neurological:     Mental Status: She is alert and oriented to person, place, and time.     Motor: No weakness.     Coordination: Coordination normal.     Gait: Gait normal.  Psychiatric:        Mood and Affect: Mood normal.        Behavior: Behavior normal.        Thought Content: Thought content normal.        Judgment: Judgment normal.      Results for orders placed or performed in visit on 04/23/23  POCT glycosylated hemoglobin (Hb A1C)  Result Value Ref Range   Hemoglobin A1C 5.5 4.0 - 5.6 %   HbA1c POC (<> result, manual entry)     HbA1c, POC (prediabetic range)     HbA1c, POC (controlled diabetic range)         Assessment & Plan:    Routine  Health Maintenance and Physical Exam  Immunization History  Administered Date(s) Administered   Pneumococcal Polysaccharide-23 05/05/2017    Health Maintenance  Topic Date Due   DTaP/Tdap/Td (1 - Tdap) Never done   MAMMOGRAM  Never done   Zoster Vaccines- Shingrix (1 of 2) Never done   Pneumococcal Vaccine 3-65 Years old (2 of 2 - PCV) 05/06/2018   COVID-19 Vaccine (1 - 2024-25 season) Never done   Cervical Cancer Screening (HPV/Pap Cotest)  02/17/2023   INFLUENZA VACCINE  05/05/2023 (Originally 09/05/2022)   OPHTHALMOLOGY EXAM  05/31/2023   Diabetic kidney evaluation - Urine ACR  07/18/2023   FOOT EXAM  07/18/2023   Diabetic kidney evaluation - eGFR measurement  10/15/2023   HEMOGLOBIN A1C  10/24/2023   Fecal DNA (Cologuard)  08/28/2025   Hepatitis C Screening  Completed   HIV Screening  Completed   HPV VACCINES  Aged Out    Discussed health benefits of physical activity, and encouraged her to engage in regular exercise appropriate for her age and condition.  Problem List Items Addressed This Visit       Cardiovascular and Mediastinum   Essential hypertension   Continue hydrochlorothiazide 25 mg daily DASH diet and commitment to daily physical activity for a minimum of 30 minutes discussed and encouraged, as a part of hypertension management. The importance of attaining a healthy weight is also discussed.     04/23/2023    8:14 AM 01/07/2023    9:03 AM 01/07/2023    8:23 AM 10/15/2022    8:46 AM 07/18/2022    8:49 AM 01/15/2022    8:57 AM 07/10/2021    9:53 AM  BP/Weight  Systolic BP 116 117 137 120 137 125 162  Diastolic BP 50 56 49 56 60 56 88  Wt.  (Lbs) 185.6  190.4 188.6 194 186   BMI 31.86 kg/m2  32.68 kg/m2 32.37 kg/m2 33.3 kg/m2 31.93 kg/m2              Endocrine   Type 2 diabetes mellitus with hyperglycemia, without long-term current use of insulin (HCC)   Lab Results  Component Value Date   HGBA1C 5.5 04/23/2023  Currently well-controlled on Ozempic 0.25 mg once weekly Continue current medication Sample given in the office today Patient counseled on low-carb diet Will reach out to the pharmacy to see if there is a need to submit a prior authorization for Ozempic      Relevant Medications   Blood Glucose Monitoring Suppl DEVI   Glucose Blood (BLOOD GLUCOSE TEST STRIPS) STRP   Lancet Device MISC   Lancets Misc. MISC   Other Relevant Orders   POCT glycosylated hemoglobin (Hb A1C) (Completed)     Other   Hyperlipidemia LDL goal <70   Lab Results  Component Value Date   CHOL 171 07/10/2021   HDL 58 07/10/2021   LDLCALC 97 07/10/2021   TRIG 88 07/10/2021   CHOLHDL 2.9 07/10/2021  Check lipid panel currently on atorvastatin 20 mg daily LDL goal is less than 70      Obesity (BMI 30-39.9)   Wt Readings from Last 3 Encounters:  04/23/23 185 lb 9.6 oz (84.2 kg)  01/07/23 190 lb 6.4 oz (86.4 kg)  10/15/22 188 lb 9.6 oz (85.5 kg)   Body mass index is 31.86 kg/m.  Patient counseled on low-carb diet Encouraged to engage in regular moderate exercises at least [place] 50 minutes weekly as tolerated On Ozempic for type 2 diabetes  Annual physical exam - Primary   Annual exam as documented.  Counseling done include healthy lifestyle involving committing to 150 minutes of exercise per week, heart healthy diet, and attaining healthy weight. The importance of adequate sleep also discussed.  Regular use of seat belt and home safety were also discussed . Changes in health habits are decided on by patient with goals and time frames set for achieving them. Immunization and cancer screening  needs are specifically  addressed at this visit.    Patient encouraged to get influenza vaccine, pneumococcal vaccine, Tdap vaccine and shingles vaccine Plans to get Pap smear at next visit Encouraged to schedule an appointment for colonoscopy Mammogram ordered      Other Visit Diagnoses       Screening mammogram for breast cancer       Relevant Orders   MM 3D SCREENING MAMMOGRAM BILATERAL BREAST     Screening for endocrine, nutritional, metabolic and immunity disorder       Relevant Orders   CBC   CMP14+EGFR   Lipid panel     Excessive ear wax, bilateral       Relevant Medications   carbamide peroxide (DEBROX) 6.5 % OTIC solution      Return in about 3 months (around 07/24/2023), or PAP, for DM, HTN.     Donell Beers, FNP

## 2023-04-24 ENCOUNTER — Other Ambulatory Visit: Payer: Self-pay

## 2023-04-24 LAB — CMP14+EGFR
ALT: 14 IU/L (ref 0–32)
AST: 10 IU/L (ref 0–40)
Albumin: 4.2 g/dL (ref 3.8–4.9)
Alkaline Phosphatase: 77 IU/L (ref 44–121)
BUN/Creatinine Ratio: 14 (ref 12–28)
BUN: 16 mg/dL (ref 8–27)
Bilirubin Total: <0.2 mg/dL (ref 0.0–1.2)
CO2: 23 mmol/L (ref 20–29)
Calcium: 10.7 mg/dL — ABNORMAL HIGH (ref 8.7–10.3)
Chloride: 104 mmol/L (ref 96–106)
Creatinine, Ser: 1.15 mg/dL — ABNORMAL HIGH (ref 0.57–1.00)
Globulin, Total: 3 g/dL (ref 1.5–4.5)
Glucose: 105 mg/dL — ABNORMAL HIGH (ref 70–99)
Potassium: 3.8 mmol/L (ref 3.5–5.2)
Sodium: 141 mmol/L (ref 134–144)
Total Protein: 7.2 g/dL (ref 6.0–8.5)
eGFR: 55 mL/min/{1.73_m2} — ABNORMAL LOW (ref >59)

## 2023-04-24 LAB — LIPID PANEL
Chol/HDL Ratio: 2.9 ratio (ref 0.0–4.4)
Cholesterol, Total: 169 mg/dL (ref 100–199)
HDL: 58 mg/dL (ref >39)
LDL Chol Calc (NIH): 93 mg/dL (ref 0–99)
Triglycerides: 101 mg/dL (ref 0–149)
VLDL Cholesterol Cal: 18 mg/dL (ref 5–40)

## 2023-04-24 LAB — CBC
Hematocrit: 38.4 % (ref 34.0–46.6)
Hemoglobin: 12.3 g/dL (ref 11.1–15.9)
MCH: 26.6 pg (ref 26.6–33.0)
MCHC: 32 g/dL (ref 31.5–35.7)
MCV: 83 fL (ref 79–97)
Platelets: 304 10*3/uL (ref 150–450)
RBC: 4.63 x10E6/uL (ref 3.77–5.28)
RDW: 13 % (ref 11.7–15.4)
WBC: 6.6 10*3/uL (ref 3.4–10.8)

## 2023-04-25 ENCOUNTER — Other Ambulatory Visit: Payer: Self-pay | Admitting: Nurse Practitioner

## 2023-04-25 DIAGNOSIS — E785 Hyperlipidemia, unspecified: Secondary | ICD-10-CM

## 2023-04-25 MED ORDER — ATORVASTATIN CALCIUM 40 MG PO TABS: 40.0000 mg | ORAL_TABLET | Freq: Every day | ORAL | 3 refills | Status: DC

## 2023-04-28 ENCOUNTER — Other Ambulatory Visit: Payer: Self-pay

## 2023-04-29 ENCOUNTER — Other Ambulatory Visit: Payer: Self-pay | Admitting: Nurse Practitioner

## 2023-04-29 DIAGNOSIS — E213 Hyperparathyroidism, unspecified: Secondary | ICD-10-CM

## 2023-04-29 LAB — PTH, INTACT AND CALCIUM
Calcium: 10.2 mg/dL (ref 8.7–10.3)
PTH: 78 pg/mL — ABNORMAL HIGH (ref 15–65)

## 2023-05-06 ENCOUNTER — Ambulatory Visit: Payer: Self-pay | Admitting: Nurse Practitioner

## 2023-05-07 ENCOUNTER — Other Ambulatory Visit: Payer: Self-pay | Admitting: Nurse Practitioner

## 2023-05-07 DIAGNOSIS — I10 Essential (primary) hypertension: Secondary | ICD-10-CM

## 2023-05-09 ENCOUNTER — Other Ambulatory Visit: Payer: Self-pay | Admitting: Nurse Practitioner

## 2023-05-09 DIAGNOSIS — Z9109 Other allergy status, other than to drugs and biological substances: Secondary | ICD-10-CM

## 2023-05-23 ENCOUNTER — Other Ambulatory Visit: Payer: Self-pay | Admitting: Nurse Practitioner

## 2023-05-23 DIAGNOSIS — M25551 Pain in right hip: Secondary | ICD-10-CM

## 2023-05-31 ENCOUNTER — Other Ambulatory Visit: Payer: Self-pay | Admitting: Nurse Practitioner

## 2023-05-31 DIAGNOSIS — E1165 Type 2 diabetes mellitus with hyperglycemia: Secondary | ICD-10-CM

## 2023-06-02 LAB — HM DIABETES EYE EXAM

## 2023-06-06 ENCOUNTER — Other Ambulatory Visit: Payer: Self-pay | Admitting: Nurse Practitioner

## 2023-06-06 DIAGNOSIS — Z9109 Other allergy status, other than to drugs and biological substances: Secondary | ICD-10-CM

## 2023-07-10 ENCOUNTER — Ambulatory Visit (INDEPENDENT_AMBULATORY_CARE_PROVIDER_SITE_OTHER): Admitting: "Endocrinology

## 2023-07-10 ENCOUNTER — Encounter: Payer: Self-pay | Admitting: "Endocrinology

## 2023-07-10 VITALS — BP 140/80 | Ht 64.0 in | Wt 185.0 lb

## 2023-07-10 DIAGNOSIS — E213 Hyperparathyroidism, unspecified: Secondary | ICD-10-CM | POA: Diagnosis not present

## 2023-07-10 NOTE — Progress Notes (Signed)
 Outpatient Endocrinology Note Jorge Newcomer, MD    Mardel Grudzien 1960/09/16 [id number]  Referring Provider: Paseda, Folashade R, FNP Primary Care Provider: Paseda, Folashade R, FNP Reason for consultation: Subjective   Assessment & Plan  Diagnoses and all orders for this visit:  Hyperparathyroidism (HCC) -     Vitamin D 1,25 dihydroxy -     Renal function panel -     PTH, intact and calcium  -     Magnesium -     VITAMIN D 25 Hydroxy (Vit-D Deficiency, Fractures) -     Calcium , 24-Hour Urine with Creatinine; Future   She was found to have elevated corrected serum calcium  of 10.5 mg/dL on 9/51/88 in with a high intact PTH level of 78 pg/ml on 04/28/23. Patient has had a high Calcium  at least since 2021.  Review of records show the highest value being 10.7 (uncorrected).  On hydrochlorothiazide  Not taking Ca/Vit D GFR at 55 on 04/23/23 Ordered labs, will follow with DXA and 24 hr urine calcium  if needed   Return in about 15 days (around 07/25/2023) for visit, labs today, pick up container for 24 hour urine today.   I have reviewed current medications, nurse's notes, allergies, vital signs, past medical and surgical history, family medical history, and social history for this encounter. Counseled patient on symptoms, examination findings, lab findings, imaging results, treatment decisions and monitoring and prognosis. The patient understood the recommendations and agrees with the treatment plan. All questions regarding treatment plan were fully answered.  Jorge Newcomer, MD  07/10/23   History of Present Illness HPI   Brenda Francis is a 63 y.o. female referred by Dr. Paseda for evaluation and management of hypercalcemia.    She was found to have elevated corrected serum calcium  of 10.5 mg/dL on 05/21/58 in with a high intact PTH level of 78 pg/ml on 04/28/23. Patient has had a high Calcium  at least since 2021.  Review of records show the highest value being 10.7  (uncorrected).   Patient denies a history of kidney stones. She  current hematuria No polyuria Yes nocturia Yes thirst No renal disease Yes anorexia No abdominal pain No heartburn No constipation No nausea or vomiting No history of peptic ulcer disease No depression No confusion No excessive fatigue No fracture No osteoporosis No headaches No numbness No tingling Yes  She takes Calcium  No She takes Vitamin D supplements No  She a history of taking chronic lithium No She a recent history of thiazide diuretic intake Yes  She family history of renal stones/hypercalcemia No a personal history of MEN syndromes/medullary thyroid cancer/ pheochromocytoma No  Physical Exam  BP (!) 140/80   Ht 5\' 4"  (1.626 m)   Wt 185 lb (83.9 kg)   BMI 31.76 kg/m    Constitutional: well developed, well nourished Head: normocephalic, atraumatic Eyes: sclera anicteric, no redness Neck: supple Lungs: normal respiratory effort Neurology: alert and oriented Skin: dry, no appreciable rashes Musculoskeletal: no appreciable defects Psychiatric: normal mood and affect   Current Medications Patient's Medications  New Prescriptions   No medications on file  Previous Medications   ATORVASTATIN  (LIPITOR) 40 MG TABLET    Take 1 tablet (40 mg total) by mouth daily.   BLOOD GLUCOSE MONITORING SUPPL DEVI    1 each by Does not apply route in the morning, at noon, and at bedtime. May substitute to any manufacturer covered by patient's insurance.   CARBAMIDE PEROXIDE (DEBROX) 6.5 % OTIC SOLUTION    Place  5 drops into both ears 2 (two) times daily.   EQ ASPIRIN  ADULT LOW DOSE 81 MG TABLET    TAKE 1 TABLET BY MOUTH ONCE DAILY SWALLOW  WHOLE   FLUOCINONIDE  CREAM (LIDEX ) 0.05 %    APPLY  CREAM EXTERNALLY TO AFFECTED AREA TWICE DAILY AS NEEDED   HYDROCHLOROTHIAZIDE  (HYDRODIURIL ) 25 MG TABLET    Take 1 tablet by mouth once daily   IBUPROFEN  (ADVIL ) 600 MG TABLET    TAKE 1 TABLET BY MOUTH EVERY 8 HOURS AS  NEEDED   LEVOCETIRIZINE (XYZAL ) 5 MG TABLET    TAKE 1 TABLET BY MOUTH ONCE DAILY IN THE EVENING   METFORMIN  (GLUCOPHAGE ) 500 MG TABLET    Take 1 tablet (500 mg total) by mouth 2 (two) times daily with a meal.   ONDANSETRON  (ZOFRAN ) 4 MG TABLET    Take 1 tablet (4 mg total) by mouth every 8 (eight) hours as needed for nausea or vomiting.   OZEMPIC , 0.25 OR 0.5 MG/DOSE, 2 MG/3ML SOPN    INJECT 0.25MG  INTO THE SKIN ONCE A WEEK  Modified Medications   No medications on file  Discontinued Medications   No medications on file    Allergies Allergies  Allergen Reactions   Gramineae Pollens Cough    Past Medical History Past Medical History:  Diagnosis Date   Eczema 06/21/2019   Essential hypertension    Tobacco dependence due to cigarettes    Type 2 diabetes mellitus with hyperglycemia, without long-term current use of insulin (HCC) 12/08/2019    Past Surgical History History reviewed. No pertinent surgical history.  Family History family history includes Anuerysm in her brother; Diabetes in her father; Lung cancer in her mother; Sickle cell anemia in her daughter; Stroke in her maternal grandmother.  Social History Social History   Socioeconomic History   Marital status: Single    Spouse name: Not on file   Number of children: 1   Years of education: Not on file   Highest education level: Not on file  Occupational History   Not on file  Tobacco Use   Smoking status: Former    Current packs/day: 0.25    Types: Cigarettes   Smokeless tobacco: Never  Vaping Use   Vaping status: Never Used  Substance and Sexual Activity   Alcohol use: Not Currently    Comment: moderation    Drug use: Never   Sexual activity: Not Currently  Other Topics Concern   Not on file  Social History Narrative   Lives with her daughter   Social Drivers of Health   Financial Resource Strain: Not on file  Food Insecurity: Not on file  Transportation Needs: Not on file  Physical Activity: Not  on file  Stress: Not on file  Social Connections: Not on file  Intimate Partner Violence: Not on file    Lab Results  Component Value Date   CHOL 169 04/23/2023   Lab Results  Component Value Date   HDL 58 04/23/2023   Lab Results  Component Value Date   LDLCALC 93 04/23/2023   Lab Results  Component Value Date   TRIG 101 04/23/2023   Lab Results  Component Value Date   CHOLHDL 2.9 04/23/2023   Lab Results  Component Value Date   CREATININE 1.15 (H) 04/23/2023   No results found for: "GFR"    Component Value Date/Time   NA 141 04/23/2023 0902   K 3.8 04/23/2023 0902   CL 104 04/23/2023 0902   CO2  23 04/23/2023 0902   GLUCOSE 105 (H) 04/23/2023 0902   GLUCOSE 128 (H) 11/17/2009 0425   BUN 16 04/23/2023 0902   CREATININE 1.15 (H) 04/23/2023 0902   CALCIUM  10.2 04/28/2023 0911   PROT 7.2 04/23/2023 0902   ALBUMIN 4.2 04/23/2023 0902   AST 10 04/23/2023 0902   ALT 14 04/23/2023 0902   ALKPHOS 77 04/23/2023 0902   BILITOT <0.2 04/23/2023 0902   GFRNONAA 70 12/07/2019 0956   GFRAA 81 12/07/2019 0956      Latest Ref Rng & Units 04/28/2023    9:11 AM 04/23/2023    9:02 AM 10/15/2022    9:23 AM  BMP  Glucose 70 - 99 mg/dL  962  952   BUN 8 - 27 mg/dL  16  18   Creatinine 8.41 - 1.00 mg/dL  3.24  4.01   BUN/Creat Ratio 12 - 28  14  19    Sodium 134 - 144 mmol/L  141  139   Potassium 3.5 - 5.2 mmol/L  3.8  3.8   Chloride 96 - 106 mmol/L  104  100   CO2 20 - 29 mmol/L  23  21   Calcium  8.7 - 10.3 mg/dL 02.7  25.3  66.4        Component Value Date/Time   WBC 6.6 04/23/2023 0902   WBC 7.0 11/17/2009 0419   RBC 4.63 04/23/2023 0902   RBC 4.53 11/17/2009 0419   HGB 12.3 04/23/2023 0902   HCT 38.4 04/23/2023 0902   PLT 304 04/23/2023 0902   MCV 83 04/23/2023 0902   MCH 26.6 04/23/2023 0902   MCH 26.9 11/17/2009 0419   MCHC 32.0 04/23/2023 0902   MCHC 34.0 11/17/2009 0419   RDW 13.0 04/23/2023 0902   LYMPHSABS 1.3 08/14/2017 1008   MONOABS 0.9 11/17/2009  0419   EOSABS 0.1 08/14/2017 1008   BASOSABS 0.0 08/14/2017 1008   Lab Results  Component Value Date   TSH 1.750 08/14/2017   TSH 3.270 11/17/2009         Parts of this note may have been dictated using voice recognition software. There may be variances in spelling and vocabulary which are unintentional. Not all errors are proofread. Please notify the [facility] if any discrepancies are noted or if the meaning of any statement is not clear.

## 2023-07-14 ENCOUNTER — Other Ambulatory Visit: Payer: Self-pay

## 2023-07-14 ENCOUNTER — Other Ambulatory Visit

## 2023-07-14 DIAGNOSIS — E213 Hyperparathyroidism, unspecified: Secondary | ICD-10-CM

## 2023-07-14 LAB — RENAL FUNCTION PANEL
Albumin: 4.3 g/dL (ref 3.6–5.1)
BUN/Creatinine Ratio: 13 (calc) (ref 6–22)
BUN: 14 mg/dL (ref 7–25)
CO2: 30 mmol/L (ref 20–32)
Calcium: 10.6 mg/dL — ABNORMAL HIGH (ref 8.6–10.4)
Chloride: 101 mmol/L (ref 98–110)
Creat: 1.09 mg/dL — ABNORMAL HIGH (ref 0.50–1.05)
Glucose, Bld: 115 mg/dL — ABNORMAL HIGH (ref 65–99)
Phosphorus: 2.9 mg/dL (ref 2.5–4.5)
Potassium: 3.5 mmol/L (ref 3.5–5.3)
Sodium: 139 mmol/L (ref 135–146)

## 2023-07-14 LAB — PTH, INTACT AND CALCIUM
Calcium: 10.6 mg/dL — ABNORMAL HIGH (ref 8.6–10.4)
PTH: 72 pg/mL (ref 16–77)

## 2023-07-14 LAB — VITAMIN D 1,25 DIHYDROXY
Vitamin D 1, 25 (OH)2 Total: 49 pg/mL (ref 18–72)
Vitamin D2 1, 25 (OH)2: <8 pg/mL
Vitamin D3 1, 25 (OH)2: 49 pg/mL

## 2023-07-14 LAB — VITAMIN D 25 HYDROXY (VIT D DEFICIENCY, FRACTURES): Vit D, 25-Hydroxy: 14 ng/mL — ABNORMAL LOW (ref 30–100)

## 2023-07-14 LAB — MAGNESIUM: Magnesium: 2.1 mg/dL (ref 1.5–2.5)

## 2023-07-15 ENCOUNTER — Other Ambulatory Visit: Payer: Self-pay | Admitting: Nurse Practitioner

## 2023-07-15 DIAGNOSIS — Z9109 Other allergy status, other than to drugs and biological substances: Secondary | ICD-10-CM

## 2023-07-15 LAB — CALCIUM, 24-HOUR URINE WITH CREATININE
CALCIUM/CREATININE RATIO: 79 mg/g{creat} (ref 30–275)
Calcium, 24H Urine: 130 mg/(24.h)
Creatinine, 24H Ur: 1.58 g/(24.h) (ref 0.50–2.15)

## 2023-07-20 ENCOUNTER — Other Ambulatory Visit: Payer: Self-pay | Admitting: Nurse Practitioner

## 2023-07-20 DIAGNOSIS — E1165 Type 2 diabetes mellitus with hyperglycemia: Secondary | ICD-10-CM

## 2023-07-25 ENCOUNTER — Ambulatory Visit: Payer: Self-pay | Admitting: Nurse Practitioner

## 2023-07-25 ENCOUNTER — Ambulatory Visit: Admitting: "Endocrinology

## 2023-07-25 ENCOUNTER — Encounter: Payer: Self-pay | Admitting: Nurse Practitioner

## 2023-07-25 ENCOUNTER — Other Ambulatory Visit (HOSPITAL_COMMUNITY)
Admission: RE | Admit: 2023-07-25 | Discharge: 2023-07-25 | Disposition: A | Discharge status: Home / Self Care | Source: Ambulatory Visit | Attending: Nurse Practitioner | Admitting: Nurse Practitioner

## 2023-07-25 VITALS — BP 127/55 | HR 66 | Temp 97.1°F | Wt 185.4 lb

## 2023-07-25 DIAGNOSIS — N841 Polyp of cervix uteri: Secondary | ICD-10-CM | POA: Insufficient documentation

## 2023-07-25 DIAGNOSIS — E1165 Type 2 diabetes mellitus with hyperglycemia: Secondary | ICD-10-CM | POA: Diagnosis not present

## 2023-07-25 DIAGNOSIS — Z124 Encounter for screening for malignant neoplasm of cervix: Secondary | ICD-10-CM | POA: Insufficient documentation

## 2023-07-25 DIAGNOSIS — E785 Hyperlipidemia, unspecified: Secondary | ICD-10-CM | POA: Diagnosis not present

## 2023-07-25 DIAGNOSIS — I10 Essential (primary) hypertension: Secondary | ICD-10-CM | POA: Diagnosis not present

## 2023-07-25 LAB — POCT GLYCOSYLATED HEMOGLOBIN (HGB A1C): Hemoglobin A1C: 5.8 % — AB (ref 4.0–5.6)

## 2023-07-25 NOTE — Progress Notes (Signed)
 Established Patient Office Visit  Subjective:  Patient ID: Brenda Francis, female    DOB: 04-22-1960  Age: 63 y.o. MRN: [id number]  CC:  Chief Complaint  Patient presents with   Diabetes   Hypertension   Gynecologic Exam    HPI Brenda Francis is a 63 y.o. female  has a past medical history of Eczema (06/21/2019), Essential hypertension, Tobacco dependence due to cigarettes, and Type 2 diabetes mellitus with hyperglycemia, without long-term current use of insulin (HCC) (12/08/2019).  Patient presents for follow-up for her chronic medical conditions She denies any complaints today Denies any adverse reactions to her current medications Mammogram was ordered but has not been completed, need for breast cancer screening discussed Encouraged to get colonoscopy done as planned    Past Medical History:  Diagnosis Date   Eczema 06/21/2019   Essential hypertension    Tobacco dependence due to cigarettes    Type 2 diabetes mellitus with hyperglycemia, without long-term current use of insulin (HCC) 12/08/2019    History reviewed. No pertinent surgical history.  Family History  Problem Relation Age of Onset   Lung cancer Mother    Diabetes Father    Anuerysm Brother    Sickle cell anemia Daughter    Stroke Maternal Grandmother     Social History   Socioeconomic History   Marital status: Single    Spouse name: Not on file   Number of children: 1   Years of education: Not on file   Highest education level: Not on file  Occupational History   Not on file  Tobacco Use   Smoking status: Former    Current packs/day: 0.25    Types: Cigarettes   Smokeless tobacco: Never  Vaping Use   Vaping status: Never Used  Substance and Sexual Activity   Alcohol use: Not Currently    Comment: moderation    Drug use: Never   Sexual activity: Not Currently  Other Topics Concern   Not on file  Social History Narrative   Lives with her daughter   Social Drivers of Research scientist (physical sciences) Strain: Not on file  Food Insecurity: Not on file  Transportation Needs: Not on file  Physical Activity: Not on file  Stress: Not on file  Social Connections: Not on file  Intimate Partner Violence: Not on file    Outpatient Medications Prior to Visit  Medication Sig Dispense Refill   atorvastatin  (LIPITOR) 40 MG tablet Take 1 tablet (40 mg total) by mouth daily. 90 tablet 3   Blood Glucose Monitoring Suppl (ACCU-CHEK GUIDE) w/Device KIT USE AS DIRECTED TO CHECK GLUCOSE IN THE MORNING AND AT NOON AND AT BEDTIME 1 kit 0   EQ ASPIRIN  ADULT LOW DOSE 81 MG tablet TAKE 1 TABLET BY MOUTH ONCE DAILY SWALLOW  WHOLE 90 tablet 0   fluocinonide  cream (LIDEX ) 0.05 % APPLY  CREAM EXTERNALLY TO AFFECTED AREA TWICE DAILY AS NEEDED 60 g 12   hydrochlorothiazide  (HYDRODIURIL ) 25 MG tablet Take 1 tablet by mouth once daily 90 tablet 0   ibuprofen  (ADVIL ) 600 MG tablet TAKE 1 TABLET BY MOUTH EVERY 8 HOURS AS NEEDED 30 tablet 0   levocetirizine (XYZAL ) 5 MG tablet TAKE 1 TABLET BY MOUTH ONCE DAILY IN THE EVENING 30 tablet 0   OZEMPIC , 0.25 OR 0.5 MG/DOSE, 2 MG/3ML SOPN INJECT 0.25MG  INTO THE SKIN ONCE A WEEK 3 mL 0   carbamide peroxide (DEBROX) 6.5 % OTIC solution Place 5 drops into both ears 2 (two)  times daily. (Patient not taking: Reported on 07/25/2023) 15 mL 0   ondansetron  (ZOFRAN ) 4 MG tablet Take 1 tablet (4 mg total) by mouth every 8 (eight) hours as needed for nausea or vomiting. (Patient not taking: Reported on 07/25/2023) 20 tablet 0   metFORMIN  (GLUCOPHAGE ) 500 MG tablet Take 1 tablet (500 mg total) by mouth 2 (two) times daily with a meal. (Patient not taking: Reported on 07/25/2023) 180 tablet 3   No facility-administered medications prior to visit.    Allergies  Allergen Reactions   Gramineae Pollens Cough    ROS Review of Systems    Objective:    Physical Exam Vitals and nursing note reviewed. Exam conducted with a chaperone present.  Constitutional:      General: She is  not in acute distress.    Appearance: Normal appearance. She is obese. She is not ill-appearing, toxic-appearing or diaphoretic.   Eyes:     General: No scleral icterus.       Right eye: No discharge.        Left eye: No discharge.     Extraocular Movements: Extraocular movements intact.     Conjunctiva/sclera: Conjunctivae normal.    Cardiovascular:     Rate and Rhythm: Normal rate and regular rhythm.     Pulses: Normal pulses.     Heart sounds: Normal heart sounds. No murmur heard.    No friction rub. No gallop.  Pulmonary:     Effort: Pulmonary effort is normal. No respiratory distress.     Breath sounds: Normal breath sounds. No stridor. No wheezing, rhonchi or rales.  Chest:     Chest wall: No mass, lacerations, deformity, swelling, tenderness or edema.  Breasts:    Tanner Score is 5.     Breasts are symmetrical.     Right: Normal. No swelling, bleeding, inverted nipple, mass, nipple discharge, skin change or tenderness.     Left: Normal. No swelling, bleeding, inverted nipple, mass, nipple discharge, skin change or tenderness.  Abdominal:     General: There is no distension.     Palpations: Abdomen is soft.     Tenderness: There is no abdominal tenderness. There is no right CVA tenderness, left CVA tenderness or guarding.     Hernia: There is no hernia in the left inguinal area or right inguinal area.  Genitourinary:    General: Normal vulva.     Exam position: Lithotomy position.     Pubic Area: No rash or pubic lice.      Tanner stage (genital): 5.     Labia:        Right: No rash, tenderness, lesion or injury.        Left: No rash, tenderness, lesion or injury.      Urethra: No prolapse, urethral pain, urethral swelling or urethral lesion.     Vagina: No signs of injury and foreign body. No vaginal discharge, erythema, tenderness, bleeding, lesions or prolapsed vaginal walls.     Cervix: Lesion present. No cervical motion tenderness, discharge, friability, erythema,  cervical bleeding or eversion.     Uterus: Normal. Not enlarged, not fixed, not tender and no uterine prolapse.      Adnexa:        Right: No mass, tenderness or fullness.         Left: No mass, tenderness or fullness.       Comments: Poly at cervical os noted  Musculoskeletal:        General: No swelling,  tenderness, deformity or signs of injury.     Right lower leg: No edema.     Left lower leg: No edema.  Lymphadenopathy:     Upper Body:     Right upper body: No supraclavicular, axillary or pectoral adenopathy.     Left upper body: No supraclavicular, axillary or pectoral adenopathy.     Lower Body: No right inguinal adenopathy. No left inguinal adenopathy.   Skin:    General: Skin is warm and dry.     Capillary Refill: Capillary refill takes less than 2 seconds.     Coloration: Skin is not jaundiced or pale.     Findings: No bruising, erythema or lesion.   Neurological:     Mental Status: She is alert and oriented to person, place, and time.     Motor: No weakness.     Gait: Gait normal.   Psychiatric:        Mood and Affect: Mood normal.        Behavior: Behavior normal.        Thought Content: Thought content normal.        Judgment: Judgment normal.     BP (!) 127/55   Pulse 66   Temp (!) 97.1 F (36.2 C)   Wt 185 lb 6.4 oz (84.1 kg)   SpO2 100%   BMI 31.82 kg/m  Wt Readings from Last 3 Encounters:  07/25/23 185 lb 6.4 oz (84.1 kg)  07/10/23 185 lb (83.9 kg)  04/23/23 185 lb 9.6 oz (84.2 kg)    Lab Results  Component Value Date   TSH 1.750 08/14/2017   Lab Results  Component Value Date   WBC 6.6 04/23/2023   HGB 12.3 04/23/2023   HCT 38.4 04/23/2023   MCV 83 04/23/2023   PLT 304 04/23/2023   Lab Results  Component Value Date   NA 139 07/10/2023   K 3.5 07/10/2023   CO2 30 07/10/2023   GLUCOSE 115 (H) 07/10/2023   BUN 14 07/10/2023   CREATININE 1.09 (H) 07/10/2023   BILITOT <0.2 04/23/2023   ALKPHOS 77 04/23/2023   AST 10 04/23/2023   ALT  14 04/23/2023   PROT 7.2 04/23/2023   ALBUMIN 4.2 04/23/2023   CALCIUM  10.6 (H) 07/10/2023   CALCIUM  10.6 (H) 07/10/2023   EGFR 55 (L) 04/23/2023   Lab Results  Component Value Date   CHOL 169 04/23/2023   Lab Results  Component Value Date   HDL 58 04/23/2023   Lab Results  Component Value Date   LDLCALC 93 04/23/2023   Lab Results  Component Value Date   TRIG 101 04/23/2023   Lab Results  Component Value Date   CHOLHDL 2.9 04/23/2023   Lab Results  Component Value Date   HGBA1C 5.8 (A) 07/25/2023      Assessment & Plan:   Problem List Items Addressed This Visit       Cardiovascular and Mediastinum   Essential hypertension   Controlled on hydrochlorothiazide  25 mg daily Continue current medication DASH diet and commitment to daily physical activity for a minimum of 30 minutes discussed and encouraged, as a part of hypertension management. The importance of attaining a healthy weight is also discussed.     07/25/2023    9:01 AM 07/25/2023    8:18 AM 07/10/2023    8:15 AM 04/23/2023    8:14 AM 01/07/2023    9:03 AM 01/07/2023    8:23 AM 10/15/2022    8:46 AM  BP/Weight  Systolic BP 127 123 140 116 117 137 120  Diastolic BP 55 44 80 50 56 49 56  Wt. (Lbs)  185.4 185 185.6  190.4 188.6  BMI  31.82 kg/m2 31.76 kg/m2 31.86 kg/m2  32.68 kg/m2 32.37 kg/m2             Endocrine   Type 2 diabetes mellitus with hyperglycemia, without long-term current use of insulin (HCC) - Primary   Lab Results  Component Value Date   HGBA1C 5.8 (A) 07/25/2023  Controlled on Ozempic  0.25 mg once weekly Continue current medication Avoid sugar sweets soda      Relevant Orders   Microalbumin/Creatinine Ratio, Urine   POCT glycosylated hemoglobin (Hb A1C) (Completed)     Genitourinary   Polyp at cervical os   Patient referred to gynecology      Relevant Orders   Ambulatory referral to Gynecology     Other   Hyperlipidemia LDL goal <70   Lab Results  Component Value  Date   CHOL 169 04/23/2023   HDL 58 04/23/2023   LDLCALC 93 04/23/2023   TRIG 101 04/23/2023   CHOLHDL 2.9 04/23/2023  Currently on atorvastatin  40 mg daily Checking lipid panel LDL goal is less than 70      Relevant Orders   Lipid panel   Screening for cervical cancer   Pap smear completed without difficulty      Relevant Orders   Cytology - PAP([place])    No orders of the defined types were placed in this encounter.   Follow-up: Return in about 6 months (around 01/24/2024) for DM, HTN.    Laderius Valbuena R Jacoba Cherney, FNP

## 2023-07-25 NOTE — Assessment & Plan Note (Signed)
 Controlled on hydrochlorothiazide  25 mg daily Continue current medication DASH diet and commitment to daily physical activity for a minimum of 30 minutes discussed and encouraged, as a part of hypertension management. The importance of attaining a healthy weight is also discussed.     07/25/2023    9:01 AM 07/25/2023    8:18 AM 07/10/2023    8:15 AM 04/23/2023    8:14 AM 01/07/2023    9:03 AM 01/07/2023    8:23 AM 10/15/2022    8:46 AM  BP/Weight  Systolic BP 127 123 140 116 117 137 120  Diastolic BP 55 44 80 50 56 49 56  Wt. (Lbs)  185.4 185 185.6  190.4 188.6  BMI  31.82 kg/m2 31.76 kg/m2 31.86 kg/m2  32.68 kg/m2 32.37 kg/m2

## 2023-07-25 NOTE — Assessment & Plan Note (Signed)
Patient referred to gynecology

## 2023-07-25 NOTE — Assessment & Plan Note (Signed)
 Lab Results  Component Value Date   CHOL 169 04/23/2023   HDL 58 04/23/2023   LDLCALC 93 04/23/2023   TRIG 101 04/23/2023   CHOLHDL 2.9 04/23/2023  Currently on atorvastatin  40 mg daily Checking lipid panel LDL goal is less than 70

## 2023-07-25 NOTE — Patient Instructions (Addendum)
 Please Schedule your  mammography today   Please consider getting, pneumococcal  Shingrix and Tdap vaccine at local pharmacy.    It is important that you exercise regularly at least 30 minutes 5 times a week as tolerated  Think about what you will eat, plan ahead. Choose  clean, green, fresh or frozen over canned, processed or packaged foods which are more sugary, salty and fatty. 70 to 75% of food eaten should be vegetables and fruit. Three meals at set times with snacks allowed between meals, but they must be fruit or vegetables. Aim to eat over a 12 hour period , example 7 am to 7 pm, and STOP after  your last meal of the day. Drink water,generally about 64 ounces per day, no other drink is as healthy. Fruit juice is best enjoyed in a healthy way, by EATING the fruit.  Thanks for choosing [facility] we consider it a privelige to serve you.

## 2023-07-25 NOTE — Assessment & Plan Note (Signed)
 Pap smear completed without difficulty

## 2023-07-25 NOTE — Assessment & Plan Note (Signed)
 Lab Results  Component Value Date   HGBA1C 5.8 (A) 07/25/2023  Controlled on Ozempic  0.25 mg once weekly Continue current medication Avoid sugar sweets soda

## 2023-07-26 LAB — MICROALBUMIN / CREATININE URINE RATIO
Creatinine, Urine: 100.3 mg/dL
Microalb/Creat Ratio: <3 mg/g{creat} (ref 0–29)
Microalbumin, Urine: <3 ug/mL

## 2023-07-26 LAB — LIPID PANEL
Chol/HDL Ratio: 2.7 ratio (ref 0.0–4.4)
Cholesterol, Total: 163 mg/dL (ref 100–199)
HDL: 61 mg/dL (ref >39)
LDL Chol Calc (NIH): 85 mg/dL (ref 0–99)
Triglycerides: 91 mg/dL (ref 0–149)
VLDL Cholesterol Cal: 17 mg/dL (ref 5–40)

## 2023-07-28 ENCOUNTER — Ambulatory Visit: Payer: Self-pay | Admitting: Nurse Practitioner

## 2023-07-28 ENCOUNTER — Other Ambulatory Visit: Payer: Self-pay | Admitting: Nurse Practitioner

## 2023-07-28 ENCOUNTER — Ambulatory Visit: Admitting: "Endocrinology

## 2023-07-28 ENCOUNTER — Encounter: Payer: Self-pay | Admitting: "Endocrinology

## 2023-07-28 VITALS — BP 140/70 | Ht 64.0 in | Wt 185.0 lb

## 2023-07-28 DIAGNOSIS — E785 Hyperlipidemia, unspecified: Secondary | ICD-10-CM

## 2023-07-28 DIAGNOSIS — Z8639 Personal history of other endocrine, nutritional and metabolic disease: Secondary | ICD-10-CM | POA: Diagnosis not present

## 2023-07-28 DIAGNOSIS — E559 Vitamin D deficiency, unspecified: Secondary | ICD-10-CM | POA: Diagnosis not present

## 2023-07-28 MED ORDER — ATORVASTATIN CALCIUM 80 MG PO TABS: 80.0000 mg | ORAL_TABLET | Freq: Every day | ORAL | 2 refills | Status: DC

## 2023-07-28 NOTE — Patient Instructions (Signed)
 Take 1000 international units of Vitamin D  over the counter everyday.  -Discussed importance of good hydration to prevent severe hypercalcemia:  - 8 eight ounce glasses of fluids per day.  - low threshold for ER visit for IV fluids if having     nausea/vomiting/diarrhea and cannot keep well hydrated.

## 2023-07-28 NOTE — Progress Notes (Signed)
 Outpatient Endocrinology Note Brenda Birmingham, MD    Brenda Francis 12-10-1960 [id number]  Referring Provider: Paseda, Brenda R, FNP Primary Care Provider: Paseda, Brenda R, FNP Reason for consultation: Subjective   Assessment & Plan  Diagnoses and all orders for this visit:  History of hyperparathyroidism -     PTH, intact and calcium  -     Renal function panel -     VITAMIN D  25 Hydroxy (Vit-D Deficiency, Fractures)  Vitamin D  deficiency -     PTH, intact and calcium  -     Renal function panel -     VITAMIN D  25 Hydroxy (Vit-D Deficiency, Fractures)    She was found to have elevated corrected serum calcium  of 10.5 mg/dL on 6/80/74 in with a high intact PTH level of 78 pg/ml on 04/28/23. Patient has had a high Calcium  at least since 2021.  Review of records show the highest value being 10.7 (uncorrected).  On hydrochlorothiazide  Not taking Ca/Vit D GFR at 55 on 04/23/23 07/10/2023 labs revealed normal PTH, corrected calcium  at 10.4, normal 125 vitamin D , normal magnesium, 24-hour urine calcium  at 130; 25 vitamin D  was low at 14 Send patient is still taking 1000 units of vitamin D  every day over-the-counter Discussed importance of good hydration to prevent severe hypercalcemia:  - 8 eight ounce glasses of fluids per day.  - low threshold for ER visit for IV fluids if having nausea/vomiting/diarrhea and cannot keep well hydrated.   Return in about 3 months (around 10/28/2023) for visit + labs before next visit.   I have reviewed current medications, nurse's notes, allergies, vital signs, past medical and surgical history, family medical history, and social history for this encounter. Counseled patient on symptoms, examination findings, lab findings, imaging results, treatment decisions and monitoring and prognosis. The patient understood the recommendations and agrees with the treatment plan. All questions regarding treatment plan were fully answered.  Brenda Birmingham,  MD  07/28/23   History of Present Illness HPI   Brenda Francis is a 63 y.o. female referred by Dr. Paseda for follow up of hypercalcemia.    Denies any active complaints No falls/fractures/bone pains No abdominal pain/nausea/vomiting/constipation/heartburn Not taking any calcium /vitamin D   Initial history:  She was found to have elevated corrected serum calcium  of 10.5 mg/dL on 6/80/74 in with a high intact PTH level of 78 pg/ml on 04/28/23. Patient has had a high Calcium  at least since 2021.  Review of records show the highest value being 10.7 (uncorrected).   Patient denies a history of kidney stones. She  current hematuria No polyuria Yes nocturia Yes thirst No renal disease Yes anorexia No abdominal pain No heartburn No constipation No nausea or vomiting No history of peptic ulcer disease No depression No confusion No excessive fatigue No fracture No osteoporosis No headaches No numbness No tingling Yes  She takes Calcium  No She takes Vitamin D  supplements No  She a history of taking chronic lithium No She a recent history of thiazide diuretic intake Yes  She family history of renal stones/hypercalcemia No a personal history of MEN syndromes/medullary thyroid cancer/ pheochromocytoma No  Physical Exam  BP (!) 140/70   Ht 5' 4 (1.626 m)   Wt 185 lb (83.9 kg)   BMI 31.76 kg/m    Constitutional: well developed, well nourished Head: normocephalic, atraumatic Eyes: sclera anicteric, no redness Neck: supple Lungs: normal respiratory effort Neurology: alert and oriented Skin: dry, no appreciable rashes Musculoskeletal: no appreciable defects Psychiatric: normal mood  and affect   Current Medications Patient's Medications  New Prescriptions   No medications on file  Previous Medications   ATORVASTATIN  (LIPITOR) 80 MG TABLET    Take 1 tablet (80 mg total) by mouth daily.   BLOOD GLUCOSE MONITORING SUPPL (ACCU-CHEK GUIDE) W/DEVICE KIT    USE AS  DIRECTED TO CHECK GLUCOSE IN THE MORNING AND AT NOON AND AT BEDTIME   CARBAMIDE PEROXIDE (DEBROX) 6.5 % OTIC SOLUTION    Place 5 drops into both ears 2 (two) times daily.   EQ ASPIRIN  ADULT LOW DOSE 81 MG TABLET    TAKE 1 TABLET BY MOUTH ONCE DAILY SWALLOW  WHOLE   FLUOCINONIDE  CREAM (LIDEX ) 0.05 %    APPLY  CREAM EXTERNALLY TO AFFECTED AREA TWICE DAILY AS NEEDED   HYDROCHLOROTHIAZIDE  (HYDRODIURIL ) 25 MG TABLET    Take 1 tablet by mouth once daily   IBUPROFEN  (ADVIL ) 600 MG TABLET    TAKE 1 TABLET BY MOUTH EVERY 8 HOURS AS NEEDED   LEVOCETIRIZINE (XYZAL ) 5 MG TABLET    TAKE 1 TABLET BY MOUTH ONCE DAILY IN THE EVENING   ONDANSETRON  (ZOFRAN ) 4 MG TABLET    Take 1 tablet (4 mg total) by mouth every 8 (eight) hours as needed for nausea or vomiting.   OZEMPIC , 0.25 OR 0.5 MG/DOSE, 2 MG/3ML SOPN    INJECT 0.25MG  INTO THE SKIN ONCE A WEEK  Modified Medications   No medications on file  Discontinued Medications   No medications on file    Allergies Allergies  Allergen Reactions   Gramineae Pollens Cough    Past Medical History Past Medical History:  Diagnosis Date   Eczema 06/21/2019   Essential hypertension    Tobacco dependence due to cigarettes    Type 2 diabetes mellitus with hyperglycemia, without long-term current use of insulin (HCC) 12/08/2019    Past Surgical History History reviewed. No pertinent surgical history.  Family History family history includes Anuerysm in her brother; Diabetes in her father; Lung cancer in her mother; Sickle cell anemia in her daughter; Stroke in her maternal grandmother.  Social History Social History   Socioeconomic History   Marital status: Single    Spouse name: Not on file   Number of children: 1   Years of education: Not on file   Highest education level: Not on file  Occupational History   Not on file  Tobacco Use   Smoking status: Former    Current packs/day: 0.25    Types: Cigarettes   Smokeless tobacco: Never  Vaping Use    Vaping status: Never Used  Substance and Sexual Activity   Alcohol use: Not Currently    Comment: moderation    Drug use: Never   Sexual activity: Not Currently  Other Topics Concern   Not on file  Social History Narrative   Lives with her daughter   Social Drivers of Health   Financial Resource Strain: Not on file  Food Insecurity: Not on file  Transportation Needs: Not on file  Physical Activity: Not on file  Stress: Not on file  Social Connections: Not on file  Intimate Partner Violence: Not on file    Lab Results  Component Value Date   CHOL 163 07/25/2023   Lab Results  Component Value Date   HDL 61 07/25/2023   Lab Results  Component Value Date   LDLCALC 85 07/25/2023   Lab Results  Component Value Date   TRIG 91 07/25/2023   Lab Results  Component  Value Date   CHOLHDL 2.7 07/25/2023   Lab Results  Component Value Date   CREATININE 1.09 (H) 07/10/2023   No results found for: GFR    Component Value Date/Time   NA 139 07/10/2023 0844   NA 141 04/23/2023 0902   K 3.5 07/10/2023 0844   CL 101 07/10/2023 0844   CO2 30 07/10/2023 0844   GLUCOSE 115 (H) 07/10/2023 0844   BUN 14 07/10/2023 0844   BUN 16 04/23/2023 0902   CREATININE 1.09 (H) 07/10/2023 0844   CALCIUM  10.6 (H) 07/10/2023 0844   CALCIUM  10.6 (H) 07/10/2023 0844   PROT 7.2 04/23/2023 0902   ALBUMIN 4.2 04/23/2023 0902   AST 10 04/23/2023 0902   ALT 14 04/23/2023 0902   ALKPHOS 77 04/23/2023 0902   BILITOT <0.2 04/23/2023 0902   GFRNONAA 70 12/07/2019 0956   GFRAA 81 12/07/2019 0956      Latest Ref Rng & Units 07/10/2023    8:44 AM 04/28/2023    9:11 AM 04/23/2023    9:02 AM  BMP  Glucose 65 - 99 mg/dL 884   894   BUN 7 - 25 mg/dL 14   16   Creatinine 9.49 - 1.05 mg/dL 8.90   8.84   BUN/Creat Ratio 6 - 22 (calc) 13   14   Sodium 135 - 146 mmol/L 139   141   Potassium 3.5 - 5.3 mmol/L 3.5   3.8   Chloride 98 - 110 mmol/L 101   104   CO2 20 - 32 mmol/L 30   23   Calcium  8.6 -  10.4 mg/dL 8.6 - 89.5 mg/dL 89.3    89.3  89.7  89.2        Component Value Date/Time   WBC 6.6 04/23/2023 0902   WBC 7.0 11/17/2009 0419   RBC 4.63 04/23/2023 0902   RBC 4.53 11/17/2009 0419   HGB 12.3 04/23/2023 0902   HCT 38.4 04/23/2023 0902   PLT 304 04/23/2023 0902   MCV 83 04/23/2023 0902   MCH 26.6 04/23/2023 0902   MCH 26.9 11/17/2009 0419   MCHC 32.0 04/23/2023 0902   MCHC 34.0 11/17/2009 0419   RDW 13.0 04/23/2023 0902   LYMPHSABS 1.3 08/14/2017 1008   MONOABS 0.9 11/17/2009 0419   EOSABS 0.1 08/14/2017 1008   BASOSABS 0.0 08/14/2017 1008   Lab Results  Component Value Date   TSH 1.750 08/14/2017   TSH 3.270 11/17/2009         Parts of this note may have been dictated using voice recognition software. There may be variances in spelling and vocabulary which are unintentional. Not all errors are proofread. Please notify the dino if any discrepancies are noted or if the meaning of any statement is not clear.

## 2023-08-05 ENCOUNTER — Other Ambulatory Visit: Payer: Self-pay | Admitting: Nurse Practitioner

## 2023-08-05 DIAGNOSIS — I10 Essential (primary) hypertension: Secondary | ICD-10-CM

## 2023-08-11 ENCOUNTER — Other Ambulatory Visit: Payer: Self-pay | Admitting: Nurse Practitioner

## 2023-08-11 DIAGNOSIS — Z9109 Other allergy status, other than to drugs and biological substances: Secondary | ICD-10-CM

## 2023-08-12 ENCOUNTER — Other Ambulatory Visit: Payer: Self-pay | Admitting: Nurse Practitioner

## 2023-08-12 DIAGNOSIS — E1165 Type 2 diabetes mellitus with hyperglycemia: Secondary | ICD-10-CM

## 2023-08-19 LAB — CYTOLOGY - PAP
Comment: NEGATIVE
Diagnosis: NEGATIVE
Diagnosis: REACTIVE
High risk HPV: NEGATIVE

## 2023-09-09 ENCOUNTER — Other Ambulatory Visit: Payer: Self-pay | Admitting: Nurse Practitioner

## 2023-09-09 DIAGNOSIS — Z9109 Other allergy status, other than to drugs and biological substances: Secondary | ICD-10-CM

## 2023-09-10 ENCOUNTER — Encounter

## 2023-09-10 ENCOUNTER — Other Ambulatory Visit: Payer: Self-pay | Admitting: Nurse Practitioner

## 2023-09-10 DIAGNOSIS — M25551 Pain in right hip: Secondary | ICD-10-CM

## 2023-09-10 NOTE — Telephone Encounter (Signed)
 Please advise [facility]

## 2023-09-11 ENCOUNTER — Other Ambulatory Visit: Payer: Self-pay | Admitting: Nurse Practitioner

## 2023-09-11 DIAGNOSIS — Z9109 Other allergy status, other than to drugs and biological substances: Secondary | ICD-10-CM

## 2023-10-01 ENCOUNTER — Encounter

## 2023-10-06 ENCOUNTER — Other Ambulatory Visit: Payer: Self-pay | Admitting: Nurse Practitioner

## 2023-10-06 DIAGNOSIS — Z9109 Other allergy status, other than to drugs and biological substances: Secondary | ICD-10-CM

## 2023-10-20 ENCOUNTER — Other Ambulatory Visit: Payer: Self-pay

## 2023-10-21 ENCOUNTER — Ambulatory Visit
Admission: RE | Admit: 2023-10-21 | Discharge: 2023-10-21 | Disposition: A | Discharge status: Home / Self Care | Source: Ambulatory Visit | Attending: Nurse Practitioner

## 2023-10-21 DIAGNOSIS — Z1231 Encounter for screening mammogram for malignant neoplasm of breast: Secondary | ICD-10-CM

## 2023-10-22 ENCOUNTER — Ambulatory Visit: Payer: Self-pay | Admitting: Nurse Practitioner

## 2023-10-23 ENCOUNTER — Other Ambulatory Visit: Payer: Self-pay | Admitting: Nurse Practitioner

## 2023-10-23 DIAGNOSIS — R928 Other abnormal and inconclusive findings on diagnostic imaging of breast: Secondary | ICD-10-CM

## 2023-10-24 ENCOUNTER — Other Ambulatory Visit: Payer: Self-pay | Admitting: Family Medicine

## 2023-10-24 DIAGNOSIS — E785 Hyperlipidemia, unspecified: Secondary | ICD-10-CM

## 2023-10-28 ENCOUNTER — Other Ambulatory Visit

## 2023-10-29 LAB — PTH, INTACT AND CALCIUM
Calcium: 10.4 mg/dL (ref 8.6–10.4)
PTH: 85 pg/mL — ABNORMAL HIGH (ref 16–77)

## 2023-10-29 LAB — RENAL FUNCTION PANEL
Albumin: 4.2 g/dL (ref 3.6–5.1)
BUN/Creatinine Ratio: 16 (calc) (ref 6–22)
BUN: 20 mg/dL (ref 7–25)
CO2: 28 mmol/L (ref 20–32)
Calcium: 10.4 mg/dL (ref 8.6–10.4)
Chloride: 103 mmol/L (ref 98–110)
Creat: 1.24 mg/dL — ABNORMAL HIGH (ref 0.50–1.05)
Glucose, Bld: 98 mg/dL (ref 65–99)
Phosphorus: 3.1 mg/dL (ref 2.5–4.5)
Potassium: 3.7 mmol/L (ref 3.5–5.3)
Sodium: 139 mmol/L (ref 135–146)

## 2023-10-29 LAB — VITAMIN D 25 HYDROXY (VIT D DEFICIENCY, FRACTURES): Vit D, 25-Hydroxy: 29 ng/mL — ABNORMAL LOW (ref 30–100)

## 2023-11-01 ENCOUNTER — Ambulatory Visit
Admission: RE | Admit: 2023-11-01 | Discharge: 2023-11-01 | Disposition: A | Source: Ambulatory Visit | Attending: Nurse Practitioner

## 2023-11-01 ENCOUNTER — Ambulatory Visit
Admission: RE | Admit: 2023-11-01 | Discharge: 2023-11-01 | Disposition: A | Discharge status: Home / Self Care | Source: Ambulatory Visit | Attending: Nurse Practitioner | Admitting: Nurse Practitioner

## 2023-11-01 ENCOUNTER — Other Ambulatory Visit: Payer: Self-pay | Admitting: Nurse Practitioner

## 2023-11-01 DIAGNOSIS — R928 Other abnormal and inconclusive findings on diagnostic imaging of breast: Secondary | ICD-10-CM

## 2023-11-01 DIAGNOSIS — I10 Essential (primary) hypertension: Secondary | ICD-10-CM

## 2023-11-02 ENCOUNTER — Other Ambulatory Visit: Payer: Self-pay | Admitting: Nurse Practitioner

## 2023-11-02 DIAGNOSIS — Z9109 Other allergy status, other than to drugs and biological substances: Secondary | ICD-10-CM

## 2023-11-04 ENCOUNTER — Ambulatory Visit (INDEPENDENT_AMBULATORY_CARE_PROVIDER_SITE_OTHER): Admitting: "Endocrinology

## 2023-11-04 ENCOUNTER — Other Ambulatory Visit: Payer: Self-pay | Admitting: Nurse Practitioner

## 2023-11-04 ENCOUNTER — Encounter: Payer: Self-pay | Admitting: "Endocrinology

## 2023-11-04 VITALS — BP 120/80 | HR 84 | Ht 64.0 in | Wt 179.0 lb

## 2023-11-04 DIAGNOSIS — E559 Vitamin D deficiency, unspecified: Secondary | ICD-10-CM

## 2023-11-04 DIAGNOSIS — N6321 Unspecified lump in the left breast, upper outer quadrant: Secondary | ICD-10-CM

## 2023-11-04 DIAGNOSIS — E210 Primary hyperparathyroidism: Secondary | ICD-10-CM | POA: Diagnosis not present

## 2023-11-04 NOTE — Progress Notes (Signed)
 Outpatient Endocrinology Note Obadiah Birmingham, MD    Karren Newland Jun 07, 1960 [id number]  Referring Provider: Paseda, Folashade R, FNP Primary Care Provider: Paseda, Folashade R, FNP Reason for consultation: Subjective   Assessment & Plan  Diagnoses and all orders for this visit:  Primary hyperparathyroidism -     DG DXA BODY COMPOSITION; Future -     PTH, intact and calcium  -     Renal function panel  Vitamin D  deficiency -     DG DXA BODY COMPOSITION; Future -     PTH, intact and calcium  -     Renal function panel   She was found to have elevated corrected serum calcium  of 10.5 mg/dL on 6/80/74 in with a high intact PTH level of 78 pg/ml on 04/28/23. Patient has had a high Calcium  at least since 2021.  Review of records show the highest value being 10.7 (uncorrected).  On hydrochlorothiazide  Not taking Calcium  GFR at 55 on 04/23/23 07/10/2023 labs revealed normal PTH, corrected calcium  at 10.4, normal 125 vitamin D , normal magnesium, 24-hour urine calcium  at 130; 25 vitamin D  was low at 14 Continue 1000 units of vitamin D  every day over-the-counter Ordered DXA scan Discussed importance of good hydration to prevent severe hypercalcemia:  - 8 eight ounce glasses of fluids per day.  - low threshold for ER visit for IV fluids if having nausea/vomiting/diarrhea and cannot keep well hydrated.   Return in about 6 months (around 05/03/2024) for visit + labs before next visit.   I have reviewed current medications, nurse's notes, allergies, vital signs, past medical and surgical history, family medical history, and social history for this encounter. Counseled patient on symptoms, examination findings, lab findings, imaging results, treatment decisions and monitoring and prognosis. The patient understood the recommendations and agrees with the treatment plan. All questions regarding treatment plan were fully answered.  Obadiah Birmingham, MD  11/04/23   History of Present  Illness HPI   Brenda Francis is a 63 y.o. female referred by Dr. Paseda for follow up of hypercalcemia.    Denies any active complaints No falls/fractures/bone pains Reports L shoulder pain for a while No abdominal pain/nausea/vomiting/constipation/diarrhea/heartburn Not taking any calcium  On 1000 units of vitamin D  every day   Initial history:  She was found to have elevated corrected serum calcium  of 10.5 mg/dL on 6/80/74 in with a high intact PTH level of 78 pg/ml on 04/28/23. Patient has had a high Calcium  at least since 2021.  Review of records show the highest value being 10.7 (uncorrected).   Patient denies a history of kidney stones. She  current hematuria No polyuria Yes nocturia Yes thirst No renal disease Yes anorexia No abdominal pain No heartburn No constipation No nausea or vomiting No history of peptic ulcer disease No depression No confusion No excessive fatigue No fracture No osteoporosis No headaches No numbness No tingling Yes  She takes Calcium  No She takes Vitamin D  supplements No  She a history of taking chronic lithium No She a recent history of thiazide diuretic intake Yes  She family history of renal stones/hypercalcemia No a personal history of MEN syndromes/medullary thyroid cancer/ pheochromocytoma No  Physical Exam  BP 120/80   Pulse 84   Ht 5' 4 (1.626 m)   Wt 179 lb (81.2 kg)   SpO2 95%   BMI 30.73 kg/m    Constitutional: well developed, well nourished Head: normocephalic, atraumatic Eyes: sclera anicteric, no redness Neck: supple Lungs: normal respiratory effort Neurology: alert  and oriented Skin: dry, no appreciable rashes Musculoskeletal: no appreciable defects Psychiatric: normal mood and affect   Current Medications Patient's Medications  New Prescriptions   No medications on file  Previous Medications   ATORVASTATIN  (LIPITOR) 80 MG TABLET    Take 1 tablet (80 mg total) by mouth daily.   BLOOD GLUCOSE  MONITORING SUPPL (ACCU-CHEK GUIDE) W/DEVICE KIT    USE AS DIRECTED TO CHECK GLUCOSE IN THE MORNING AND AT NOON AND AT BEDTIME   CARBAMIDE PEROXIDE (DEBROX) 6.5 % OTIC SOLUTION    Place 5 drops into both ears 2 (two) times daily.   EQ ASPIRIN  ADULT LOW DOSE 81 MG TABLET    TAKE 1 TABLET BY MOUTH ONCE DAILY SWALLOW  WHOLE.   FLUOCINONIDE  CREAM (LIDEX ) 0.05 %    APPLY  CREAM EXTERNALLY TO AFFECTED AREA TWICE DAILY AS NEEDED   HYDROCHLOROTHIAZIDE  (HYDRODIURIL ) 25 MG TABLET    Take 1 tablet by mouth once daily   IBUPROFEN  (ADVIL ) 600 MG TABLET    TAKE 1 TABLET BY MOUTH EVERY 8 HOURS AS NEEDED   LEVOCETIRIZINE (XYZAL ) 5 MG TABLET    TAKE 1 TABLET BY MOUTH ONCE DAILY IN THE EVENING   ONDANSETRON  (ZOFRAN ) 4 MG TABLET    Take 1 tablet (4 mg total) by mouth every 8 (eight) hours as needed for nausea or vomiting.   OZEMPIC , 0.25 OR 0.5 MG/DOSE, 2 MG/3ML SOPN    INJECT 0.25 MG  SUBCUTANEOUSLY ONCE A WEEK  Modified Medications   No medications on file  Discontinued Medications   No medications on file    Allergies Allergies  Allergen Reactions   Gramineae Pollens Cough    Past Medical History Past Medical History:  Diagnosis Date   Eczema 06/21/2019   Essential hypertension    Tobacco dependence due to cigarettes    Type 2 diabetes mellitus with hyperglycemia, without long-term current use of insulin (HCC) 12/08/2019    Past Surgical History No past surgical history on file.  Family History family history includes Anuerysm in her brother; Diabetes in her father; Lung cancer in her mother; Sickle cell anemia in her daughter; Stroke in her maternal grandmother.  Social History Social History   Socioeconomic History   Marital status: Single    Spouse name: Not on file   Number of children: 1   Years of education: Not on file   Highest education level: Not on file  Occupational History   Not on file  Tobacco Use   Smoking status: Former    Current packs/day: 0.25    Types: Cigarettes    Smokeless tobacco: Never  Vaping Use   Vaping status: Never Used  Substance and Sexual Activity   Alcohol use: Not Currently    Comment: moderation    Drug use: Never   Sexual activity: Not Currently  Other Topics Concern   Not on file  Social History Narrative   Lives with her daughter   Social Drivers of Health   Financial Resource Strain: Not on file  Food Insecurity: Not on file  Transportation Needs: Not on file  Physical Activity: Not on file  Stress: Not on file  Social Connections: Not on file  Intimate Partner Violence: Not on file    Lab Results  Component Value Date   CHOL 163 07/25/2023   Lab Results  Component Value Date   HDL 61 07/25/2023   Lab Results  Component Value Date   LDLCALC 85 07/25/2023   Lab Results  Component Value Date   TRIG 91 07/25/2023   Lab Results  Component Value Date   CHOLHDL 2.7 07/25/2023   Lab Results  Component Value Date   CREATININE 1.24 (H) 10/28/2023   No results found for: GFR    Component Value Date/Time   NA 139 10/28/2023 1102   NA 141 04/23/2023 0902   K 3.7 10/28/2023 1102   CL 103 10/28/2023 1102   CO2 28 10/28/2023 1102   GLUCOSE 98 10/28/2023 1102   BUN 20 10/28/2023 1102   BUN 16 04/23/2023 0902   CREATININE 1.24 (H) 10/28/2023 1102   CALCIUM  10.4 10/28/2023 1102   CALCIUM  10.4 10/28/2023 1102   PROT 7.2 04/23/2023 0902   ALBUMIN 4.2 04/23/2023 0902   AST 10 04/23/2023 0902   ALT 14 04/23/2023 0902   ALKPHOS 77 04/23/2023 0902   BILITOT <0.2 04/23/2023 0902   GFRNONAA 70 12/07/2019 0956   GFRAA 81 12/07/2019 0956      Latest Ref Rng & Units 10/28/2023   11:02 AM 07/10/2023    8:44 AM 04/28/2023    9:11 AM  BMP  Glucose 65 - 99 mg/dL 98  884    BUN 7 - 25 mg/dL 20  14    Creatinine 9.49 - 1.05 mg/dL 8.75  8.90    BUN/Creat Ratio 6 - 22 (calc) 16  13    Sodium 135 - 146 mmol/L 139  139    Potassium 3.5 - 5.3 mmol/L 3.7  3.5    Chloride 98 - 110 mmol/L 103  101    CO2 20 - 32  mmol/L 28  30    Calcium  8.6 - 10.4 mg/dL 8.6 - 89.5 mg/dL 89.5    89.5  89.3    89.3  10.2        Component Value Date/Time   WBC 6.6 04/23/2023 0902   WBC 7.0 11/17/2009 0419   RBC 4.63 04/23/2023 0902   RBC 4.53 11/17/2009 0419   HGB 12.3 04/23/2023 0902   HCT 38.4 04/23/2023 0902   PLT 304 04/23/2023 0902   MCV 83 04/23/2023 0902   MCH 26.6 04/23/2023 0902   MCH 26.9 11/17/2009 0419   MCHC 32.0 04/23/2023 0902   MCHC 34.0 11/17/2009 0419   RDW 13.0 04/23/2023 0902   LYMPHSABS 1.3 08/14/2017 1008   MONOABS 0.9 11/17/2009 0419   EOSABS 0.1 08/14/2017 1008   BASOSABS 0.0 08/14/2017 1008   Lab Results  Component Value Date   TSH 1.750 08/14/2017   TSH 3.270 11/17/2009         Parts of this note may have been dictated using voice recognition software. There may be variances in spelling and vocabulary which are unintentional. Not all errors are proofread. Please notify the dino if any discrepancies are noted or if the meaning of any statement is not clear.

## 2023-11-06 NOTE — Addendum Note (Signed)
 Addended by: Linzie Boursiquot on: 11/06/2023 01:16 PM   Modules accepted: Orders

## 2023-11-14 ENCOUNTER — Telehealth (HOSPITAL_BASED_OUTPATIENT_CLINIC_OR_DEPARTMENT_OTHER): Payer: Self-pay

## 2023-12-15 ENCOUNTER — Ambulatory Visit: Payer: Self-pay

## 2023-12-15 NOTE — Telephone Encounter (Signed)
 FYI Only or Action Required?: FYI only for provider: appointment scheduled on 12/17/2023.  Patient was last seen in primary care on 07/25/2023 by Paseda, Folashade R, FNP.  Called Nurse Triage reporting Nasal Congestion.  Symptoms began about a month ago.  Interventions attempted: levocetirizine .  Symptoms are: gradually worsening.  Triage Disposition: See PCP When Office is Open (Within 3 Days)  Patient/caregiver understands and will follow disposition?: Yes          Copied from CRM [id number]. Topic: Clinical - Red Word Triage >> Dec 15, 2023  9:49 AM Terri MATSU wrote: Red Word that prompted transfer to Nurse Triage: difficulty breathing, can't sleep. Reason for Disposition  [1] Nasal discharge AND [2] present > 10 days  Answer Assessment - Initial Assessment Questions 1. LOCATION: Where does it hurt?      Denies pain  2. ONSET: When did the sinus pain start?  (e.g., hours, days)      Mid October  3. SEVERITY: How bad is the pain?   (Scale 0-10; or none, mild, moderate or severe)     None  4. RECURRENT SYMPTOM: Have you ever had sinus problems before? If Yes, ask: When was the last time? and What happened that time?      Denies  5. NASAL CONGESTION: Is the nose blocked? If Yes, ask: Can you open it or must you breathe through your mouth?     Yes has to breathe through her nose   6. NASAL DISCHARGE: Do you have discharge from your nose? If so ask, What color?     Clear  7. FEVER: Do you have a fever? If Yes, ask: What is it, how was it measured, and when did it start?      Denies  8. OTHER SYMPTOMS: Do you have any other symptoms? (e.g., sore throat, cough, earache, difficulty breathing)     Ear pain, difficulty breathing and sleeping   Taking levocetirizine for allergies  Protocols used: Sinus Pain or Congestion-A-AH

## 2023-12-15 NOTE — Telephone Encounter (Signed)
 Patient has scheduled an appt with PCP on 12/17/2023. Soonest appt. Provided when she called.

## 2023-12-17 ENCOUNTER — Encounter: Payer: Self-pay | Admitting: Nurse Practitioner

## 2023-12-17 ENCOUNTER — Ambulatory Visit (INDEPENDENT_AMBULATORY_CARE_PROVIDER_SITE_OTHER): Payer: Self-pay | Admitting: Nurse Practitioner

## 2023-12-17 VITALS — BP 122/69 | HR 77 | Wt 179.0 lb

## 2023-12-17 DIAGNOSIS — H6123 Impacted cerumen, bilateral: Secondary | ICD-10-CM | POA: Diagnosis not present

## 2023-12-17 DIAGNOSIS — J309 Allergic rhinitis, unspecified: Secondary | ICD-10-CM

## 2023-12-17 MED ORDER — MONTELUKAST SODIUM 10 MG PO TABS: 10.0000 mg | ORAL_TABLET | Freq: Every day | ORAL | 3 refills | Status: DC

## 2023-12-17 MED ORDER — SALINE SPRAY 0.65 % NA SOLN: 1.0000 | NASAL | 0 refills | Status: AC | PRN

## 2023-12-17 MED ORDER — MONTELUKAST SODIUM 10 MG PO TABS: 10.0000 mg | ORAL_TABLET | Freq: Every day | ORAL | 3 refills | Status: AC

## 2023-12-17 MED ORDER — FLUTICASONE PROPIONATE 50 MCG/ACT NA SUSP
2.0000 | Freq: Every day | NASAL | 6 refills | Status: AC
Start: 2023-12-17 — End: ?

## 2023-12-17 MED ORDER — LEVOCETIRIZINE DIHYDROCHLORIDE 5 MG PO TABS: 5.0000 mg | ORAL_TABLET | Freq: Every evening | ORAL | 0 refills | Status: DC

## 2023-12-17 NOTE — Assessment & Plan Note (Signed)
  Chronic nasal congestion and sneezing with ineffective response to cetirizine , Claritin, Allegra, and montelukast. Possible allergist referral if symptoms persist Stop Xyzal . - Prescribed Flonase nasal spray, two sprays in each nostril daily. - Prescribed montelukast 10 mg daily at bedtime. Saline nasal spray as needed for congestion ordered - Advised to continue medication through the winter season and into spring. - Will refer to allergist if symptoms do not improve in 1-2 weeks.

## 2023-12-17 NOTE — Patient Instructions (Signed)
 1. Allergic rhinitis, unspecified seasonality, unspecified trigger (Primary)  - fluticasone (FLONASE) 50 MCG/ACT nasal spray; Place 2 sprays into both nostrils daily.  Dispense: 16 g; Refill: 6 - montelukast (SINGULAIR) 10 MG tablet; Take 1 tablet (10 mg total) by mouth at bedtime.  Dispense: 30 tablet; Refill: 3 - sodium chloride (OCEAN) 0.65 % SOLN nasal spray; Place 1 spray into both nostrils as needed.  Dispense: 30 mL; Refill: 0   STOP TAKING Xyzal   OK to use Debrox (peroxide) in the ear to loosen up wax.. Do not use Q-tips as this can impact wax further.    It is important that you exercise regularly at least 30 minutes 5 times a week as tolerated  Think about what you will eat, plan ahead. Choose  clean, green, fresh or frozen over canned, processed or packaged foods which are more sugary, salty and fatty. 70 to 75% of food eaten should be vegetables and fruit. Three meals at set times with snacks allowed between meals, but they must be fruit or vegetables. Aim to eat over a 12 hour period , example 7 am to 7 pm, and STOP after  your last meal of the day. Drink water,generally about 64 ounces per day, no other drink is as healthy. Fruit juice is best enjoyed in a healthy way, by EATING the fruit.  Thanks for choosing [facility] we consider it a privelige to serve you.

## 2023-12-17 NOTE — Assessment & Plan Note (Signed)
 Procedure note: ear wax lavage Verbal consent obtained Both ear irrigated with combination of water and peroxide Wax successfully removed Pt tolerated well No immediate complication noted

## 2023-12-17 NOTE — Progress Notes (Signed)
 Acute Office Visit  Subjective:     Patient ID: Brenda Francis, female    DOB: 11/02/1960, 63 y.o.   MRN: [id number]  Chief Complaint  Patient presents with   Nasal Congestion    HPI Discussed the use of AI scribe software for clinical note transcription with the patient, who gave verbal consent to proceed.  History of Present Illness Brenda Francis is a 63 year old female who presents with nasal congestion and suspected allergies.  She has been experiencing nasal congestion since the end of October, which has worsened with the cold weather. Her nasal passages feel obstructed. She reports sneezing and rhinorrhea. She denies cough, throat itchiness, fever, chills, chest pain, or dyspnea. She notes some ocular pruritus and lacrimation.  She has been taking cetirizine  5 mg daily without relief. She has not tried fluticasone nasal spray but used a [employer] brand nasal spray, which was ineffective, leading to discontinuation. She previously tried montelukast without success.  Her nasal congestion affects her ears, making them feel clogged. She has used a cerumenolytic solution and a suction device in the past, which temporarily relieved the blockage. She is concerned about the congestion affecting her ears, particularly when blowing her nose.  The nasal congestion causes difficulty breathing through her nose, leading to mouth breathing and xerostomia. Blowing her nose irritates her ears.    Assessment & Plan     Review of Systems  Constitutional:  Negative for appetite change, chills, fatigue and fever.  HENT:  Positive for rhinorrhea and sneezing. Negative for congestion and sinus pressure.   Respiratory:  Negative for cough, shortness of breath and wheezing.   Cardiovascular:  Negative for chest pain, palpitations and leg swelling.  Gastrointestinal:  Negative for abdominal pain, constipation, nausea and vomiting.  Genitourinary:  Negative for difficulty urinating, dysuria, flank pain  and frequency.  Musculoskeletal:  Negative for arthralgias, back pain, joint swelling and myalgias.  Skin:  Negative for color change, pallor, rash and wound.  Neurological:  Negative for dizziness, facial asymmetry, weakness, numbness and headaches.  Psychiatric/Behavioral:  Negative for behavioral problems, confusion, self-injury and suicidal ideas.         Objective:    BP 122/69   Pulse 77   Wt 179 lb (81.2 kg)   SpO2 100%   BMI 30.73 kg/m    Physical Exam Vitals and nursing note reviewed.  Constitutional:      General: She is not in acute distress.    Appearance: Normal appearance. She is obese. She is not ill-appearing, toxic-appearing or diaphoretic.  HENT:     Right Ear: Tympanic membrane, ear canal and external ear normal. There is impacted cerumen.     Left Ear: Tympanic membrane, ear canal and external ear normal. There is impacted cerumen.     Ears:     Comments: Bilateral tympanic membranes  normal after ear flushing    Nose: Congestion present. No rhinorrhea.     Right Turbinates: Enlarged.     Left Turbinates: Enlarged.     Mouth/Throat:     Mouth: Mucous membranes are moist.     Pharynx: Oropharynx is clear. No oropharyngeal exudate or posterior oropharyngeal erythema.  Eyes:     General: No scleral icterus.       Right eye: No discharge.        Left eye: No discharge.     Extraocular Movements: Extraocular movements intact.     Conjunctiva/sclera: Conjunctivae normal.  Cardiovascular:     Rate and  Rhythm: Normal rate and regular rhythm.     Pulses: Normal pulses.     Heart sounds: Normal heart sounds. No murmur heard.    No friction rub. No gallop.  Pulmonary:     Effort: Pulmonary effort is normal. No respiratory distress.     Breath sounds: Normal breath sounds. No stridor. No wheezing, rhonchi or rales.  Chest:     Chest wall: No tenderness.  Abdominal:     General: There is no distension.     Palpations: Abdomen is soft.     Tenderness: There  is no abdominal tenderness. There is no right CVA tenderness, left CVA tenderness or guarding.  Musculoskeletal:        General: No swelling, tenderness, deformity or signs of injury.     Right lower leg: No edema.     Left lower leg: No edema.  Skin:    General: Skin is warm and dry.     Capillary Refill: Capillary refill takes less than 2 seconds.     Coloration: Skin is not jaundiced or pale.     Findings: No bruising, erythema or lesion.  Neurological:     Mental Status: She is alert and oriented to person, place, and time.     Motor: No weakness.     Gait: Gait normal.  Psychiatric:        Mood and Affect: Mood normal.        Behavior: Behavior normal.        Thought Content: Thought content normal.        Judgment: Judgment normal.     No results found for any visits on 12/17/23.      Assessment & Plan:   Problem List Items Addressed This Visit       Respiratory   Allergic rhinitis - Primary    Chronic nasal congestion and sneezing with ineffective response to cetirizine , Claritin, Allegra, and montelukast. Possible allergist referral if symptoms persist Stop Xyzal . - Prescribed Flonase nasal spray, two sprays in each nostril daily. - Prescribed montelukast 10 mg daily at bedtime. Saline nasal spray as needed for congestion ordered - Advised to continue medication through the winter season and into spring. - Will refer to allergist if symptoms do not improve in 1-2 weeks.        Relevant Medications   fluticasone (FLONASE) 50 MCG/ACT nasal spray   sodium chloride (OCEAN) 0.65 % SOLN nasal spray   montelukast (SINGULAIR) 10 MG tablet     Nervous and Auditory   Impacted cerumen of both ears   Procedure note: ear wax lavage Verbal consent obtained Both ear irrigated with combination of water and peroxide Wax successfully removed Pt tolerated well No immediate complication noted        Meds ordered this encounter  Medications   fluticasone (FLONASE)  50 MCG/ACT nasal spray    Sig: Place 2 sprays into both nostrils daily.    Dispense:  16 g    Refill:  6   DISCONTD: montelukast (SINGULAIR) 10 MG tablet    Sig: Take 1 tablet (10 mg total) by mouth at bedtime.    Dispense:  30 tablet    Refill:  3   sodium chloride (OCEAN) 0.65 % SOLN nasal spray    Sig: Place 1 spray into both nostrils as needed.    Dispense:  30 mL    Refill:  0   DISCONTD: levocetirizine (XYZAL ) 5 MG tablet    Sig: Take 1 tablet (5  mg total) by mouth every evening.    Dispense:  90 tablet    Refill:  0   montelukast (SINGULAIR) 10 MG tablet    Sig: Take 1 tablet (10 mg total) by mouth at bedtime.    Dispense:  30 tablet    Refill:  3    Please do not fill Rx for Xyzal     No follow-ups on file.  Shine Mikes R Baylee Mccorkel, FNP

## 2023-12-30 ENCOUNTER — Other Ambulatory Visit: Payer: Self-pay | Admitting: Nurse Practitioner

## 2023-12-30 DIAGNOSIS — E1165 Type 2 diabetes mellitus with hyperglycemia: Secondary | ICD-10-CM

## 2024-01-12 ENCOUNTER — Ambulatory Visit: Admitting: Obstetrics and Gynecology

## 2024-01-12 VITALS — BP 130/63 | HR 74 | Wt 180.0 lb

## 2024-01-12 DIAGNOSIS — N841 Polyp of cervix uteri: Secondary | ICD-10-CM

## 2024-01-12 NOTE — Progress Notes (Signed)
   NEW GYNECOLOGY PATIENT Patient name: Brenda Francis MRN [id number]  Date of birth: 1960-11-05 Chief Complaint:   Gynecologic Exam     History:  FMP since 63 years old. Not currently sexually active. No abnormal bleeding or discharge. No pain. No prior diagnosis of polyp.  No prior hx of abnormal pap smear. No prior cervical procedure.       Gynecologic History No LMP recorded. Patient is postmenopausal. Contraception: abstinence   OB History  No obstetric history on file.     The following portions of the patient's history were reviewed and updated as appropriate: allergies, current medications, past family history, past medical history, past social history, past surgical history and problem list. Health Maintenance  Topic Date Due   DTaP/Tdap/Td vaccine (1 - Tdap) Never done   Zoster (Shingles) Vaccine (1 of 2) Never done   Pneumococcal Vaccine for age over 30 (2 of 2 - PCV) 05/06/2018   COVID-19 Vaccine (1 - 2025-26 season) Never done   Flu Shot  05/04/2024*   Hemoglobin A1C  01/24/2024   Eye exam for diabetics  06/01/2024   Yearly kidney health urinalysis for diabetes  07/24/2024   Complete foot exam   07/24/2024   Yearly kidney function blood test for diabetes  10/27/2024   Cologuard (Stool DNA test)  08/28/2025   Breast Cancer Screening  10/20/2025   Pap with HPV screening  07/24/2028   Hepatitis C Screening  Completed   HIV Screening  Completed   Hepatitis B Vaccine  Aged Out   HPV Vaccine  Aged Out   Meningitis B Vaccine  Aged Out  *Topic was postponed. The date shown is not the original due date.     Review of Systems Pertinent items noted in HPI and remainder of comprehensive ROS otherwise negative.  Physical Exam:  BP 130/63 (BP Location: Left Arm, Patient Position: Sitting, Cuff Size: Normal)   Pulse 74   Wt 180 lb (81.6 kg)   BMI 30.90 kg/m  Physical Exam Vitals and nursing note reviewed. Exam conducted with a chaperone present.  Constitutional:       Appearance: Normal appearance.  Pulmonary:     Effort: Pulmonary effort is normal.  Abdominal:     Palpations: Abdomen is soft.  Genitourinary:    General: Normal vulva.     Exam position: Lithotomy position.     Comments: Small cervix 2mm polyp seen at external os, non-friable and nontender  Neurological:     Mental Status: She is alert.        Assessment and Plan:  1. Polyp at cervical os (Primary) Benign, asymptomatic polyp without any abnormal findings. Discussed can monitor for now given asymptomatic and small. If there is bleeding or abnormal discharge, certainly recommend removal at that point. Can also remove it without symptoms to ensure no abnormalities present.     Follow-up: No follow-ups on file.      Carter Quarry, MD Obstetrician & Gynecologist, [facility] [employer], [facility]

## 2024-01-26 ENCOUNTER — Ambulatory Visit: Payer: Self-pay | Admitting: Nurse Practitioner

## 2024-01-26 ENCOUNTER — Encounter: Payer: Self-pay | Admitting: Nurse Practitioner

## 2024-01-26 VITALS — BP 124/64 | HR 76 | Wt 177.0 lb

## 2024-01-26 DIAGNOSIS — Z0000 Encounter for general adult medical examination without abnormal findings: Secondary | ICD-10-CM | POA: Insufficient documentation

## 2024-01-26 DIAGNOSIS — E1165 Type 2 diabetes mellitus with hyperglycemia: Secondary | ICD-10-CM | POA: Diagnosis not present

## 2024-01-26 DIAGNOSIS — I10 Essential (primary) hypertension: Secondary | ICD-10-CM | POA: Diagnosis not present

## 2024-01-26 DIAGNOSIS — E785 Hyperlipidemia, unspecified: Secondary | ICD-10-CM

## 2024-01-26 DIAGNOSIS — J309 Allergic rhinitis, unspecified: Secondary | ICD-10-CM

## 2024-01-26 LAB — POCT GLYCOSYLATED HEMOGLOBIN (HGB A1C): Hemoglobin A1C: 5.9 % — AB (ref 4.0–5.6)

## 2024-01-26 MED ORDER — OZEMPIC (0.25 OR 0.5 MG/DOSE) 2 MG/3ML ~~LOC~~ SOPN: 0.2500 mg | PEN_INJECTOR | SUBCUTANEOUS | 3 refills | Status: AC

## 2024-01-26 MED ORDER — HYDROCHLOROTHIAZIDE 25 MG PO TABS: 25.0000 mg | ORAL_TABLET | Freq: Every day | ORAL | 1 refills | Status: DC

## 2024-01-26 MED ORDER — ATORVASTATIN CALCIUM 80 MG PO TABS: 80.0000 mg | ORAL_TABLET | Freq: Every day | ORAL | 2 refills | Status: AC

## 2024-01-26 NOTE — Assessment & Plan Note (Signed)
 BP Readings from Last 3 Encounters:  01/26/24 124/64  01/12/24 130/63  12/17/23 122/69  Addendum  Well-controlled with a blood pressure reading of 124/64 mmHg. - hydrochlorothiazide  25 mg daily discontinued due to hypercalcemia, starting valsartan  80 mg daily this will also help with kidney protection  - Encouraged regular exercise, such as fast walking for 30 minutes five days a week. - Encouraged a heart-healthy, low salt, low fat diet. BMP in 2 weeks follow-up in 4 weeks

## 2024-01-26 NOTE — Assessment & Plan Note (Signed)
" °  Managed with  montelukast . Recent onset of rhinorrhea likely due to a viral upper respiratory infection. Has not been using floanse nasal spray, advised Flonase  nasal spary 2 spray into both nostrils daily  - Continue montelukast  10 mg oral daily at bedtime.  "

## 2024-01-26 NOTE — Progress Notes (Signed)
 "  Established Patient Office Visit  Subjective:  Patient ID: Brenda Francis, female    DOB: 08/06/1960  Age: 63 y.o. MRN: [id number]  CC:  Chief Complaint  Patient presents with   Hypertension    6 month f/u.     HPI  Discussed the use of AI scribe software for clinical note transcription with the patient, who gave verbal consent to proceed.  History of Present Illness Brenda Francis is a 63 year old female   has a past medical history of Eczema (06/21/2019), Essential hypertension, Tobacco dependence due to cigarettes, and Type 2 diabetes mellitus with hyperglycemia, without long-term current use of insulin (HCC) (12/08/2019). who presents for a follow-up visit.  Her blood pressure is 124/64 mmHg, and she is taking hydrochlorothiazide  25 mg daily for hypertension.  Her diabetes management is stable with an A1c of 5.9%. She is on Ozempic  0.25 mg once a week.  For hyperlipidemia, she is taking atorvastatin  80 mg daily. Her last lipid panel six months ago showed elevated LDL cholesterol.  She has a recent onset of rhinorrhea and sniffles, which she attributes to a cold her daughter had. No fever, chills, chest pain, shortness of breath, abdominal pain, nausea, or vomiting. She is taking her allergy medication, 10 mg at bedtime, regularly.  She stays active through her work but does not engage in additional physical activities outside of work.    Assessment & Plan     Past Medical History:  Diagnosis Date   Eczema 06/21/2019   Essential hypertension    Tobacco dependence due to cigarettes    Type 2 diabetes mellitus with hyperglycemia, without long-term current use of insulin (HCC) 12/08/2019    No past surgical history on file.  Family History  Problem Relation Age of Onset   Lung cancer Mother    Diabetes Father    Anuerysm Brother    Sickle cell anemia Daughter    Stroke Maternal Grandmother     Social History   Socioeconomic History   Marital status: Single     Spouse name: Not on file   Number of children: 1   Years of education: Not on file   Highest education level: Not on file  Occupational History   Not on file  Tobacco Use   Smoking status: Former    Current packs/day: 0.25    Types: Cigarettes   Smokeless tobacco: Never  Vaping Use   Vaping status: Never Used  Substance and Sexual Activity   Alcohol use: Not Currently    Comment: moderation    Drug use: Never   Sexual activity: Not Currently  Other Topics Concern   Not on file  Social History Narrative   Lives with her daughter   Social Drivers of Health   Tobacco Use: Medium Risk (01/26/2024)   Patient History    Smoking Tobacco Use: Former    Smokeless Tobacco Use: Never    Passive Exposure: Not on Actuary Strain: Not on file  Food Insecurity: No Food Insecurity (01/26/2024)   Epic    Worried About Programme Researcher, Broadcasting/film/video in the Last Year: Never true    Ran Out of Food in the Last Year: Never true  Recent Concern: Food Insecurity - Food Insecurity Present (01/12/2024)   Epic    Worried About Programme Researcher, Broadcasting/film/video in the Last Year: Never true    Ran Out of Food in the Last Year: Sometimes true  Transportation Needs: No Transportation Needs (01/26/2024)  Epic    Lack of Transportation (Medical): No    Lack of Transportation (Non-Medical): No  Physical Activity: Not on file  Stress: Not on file  Social Connections: Not on file  Intimate Partner Violence: Not At Risk (01/26/2024)   Epic    Fear of Current or Ex-Partner: No    Emotionally Abused: No    Physically Abused: No    Sexually Abused: No  Depression (PHQ2-9): Low Risk (01/12/2024)   Depression (PHQ2-9)    PHQ-2 Score: 0  Alcohol Screen: Not on file  Housing: Unknown (01/26/2024)   Epic    Unable to Pay for Housing in the Last Year: Not on file    Number of Times Moved in the Last Year: 0    Homeless in the Last Year: No  Utilities: Not on file  Health Literacy: Not on file    Outpatient  Medications Prior to Visit  Medication Sig Dispense Refill   Blood Glucose Monitoring Suppl (ACCU-CHEK GUIDE) w/Device KIT USE AS DIRECTED TO CHECK GLUCOSE IN THE MORNING AND AT NOON AND AT BEDTIME 1 kit 0   carbamide peroxide (DEBROX) 6.5 % OTIC solution Place 5 drops into both ears 2 (two) times daily. 15 mL 0   EQ ASPIRIN  ADULT LOW DOSE 81 MG tablet TAKE 1 TABLET BY MOUTH ONCE DAILY SWALLOW  WHOLE. 90 tablet 0   fluocinonide  cream (LIDEX ) 0.05 % APPLY  CREAM EXTERNALLY TO AFFECTED AREA TWICE DAILY AS NEEDED 60 g 12   fluticasone  (FLONASE ) 50 MCG/ACT nasal spray Place 2 sprays into both nostrils daily. 16 g 6   ibuprofen  (ADVIL ) 600 MG tablet TAKE 1 TABLET BY MOUTH EVERY 8 HOURS AS NEEDED 30 tablet 0   montelukast  (SINGULAIR ) 10 MG tablet Take 1 tablet (10 mg total) by mouth at bedtime. 30 tablet 3   ondansetron  (ZOFRAN ) 4 MG tablet Take 1 tablet (4 mg total) by mouth every 8 (eight) hours as needed for nausea or vomiting. 20 tablet 0   sodium chloride (OCEAN) 0.65 % SOLN nasal spray Place 1 spray into both nostrils as needed. 30 mL 0   atorvastatin  (LIPITOR) 80 MG tablet Take 1 tablet (80 mg total) by mouth daily. 90 tablet 2   hydrochlorothiazide  (HYDRODIURIL ) 25 MG tablet Take 1 tablet by mouth once daily 90 tablet 0   OZEMPIC , 0.25 OR 0.5 MG/DOSE, 2 MG/3ML SOPN INJECT 0.25MG  INTO THE SKIN ONCE A WEEK 3 mL 0   No facility-administered medications prior to visit.    Allergies[1]  ROS Review of Systems  Constitutional:  Negative for appetite change, chills, fatigue and fever.  HENT:  Positive for congestion and rhinorrhea.   Respiratory:  Negative for cough, shortness of breath and wheezing.   Cardiovascular:  Negative for chest pain, palpitations and leg swelling.  Gastrointestinal:  Negative for abdominal pain, constipation, nausea and vomiting.  Genitourinary:  Negative for difficulty urinating, dysuria, flank pain and frequency.  Musculoskeletal:  Negative for arthralgias, back  pain, joint swelling and myalgias.  Skin:  Negative for color change, pallor, rash and wound.  Neurological:  Negative for dizziness, facial asymmetry, weakness, numbness and headaches.  Psychiatric/Behavioral:  Negative for behavioral problems, confusion, self-injury and suicidal ideas.       Objective:    Physical Exam Vitals and nursing note reviewed.  Constitutional:      General: She is not in acute distress.    Appearance: Normal appearance. She is not ill-appearing, toxic-appearing or diaphoretic.  HENT:  Mouth/Throat:     Mouth: Mucous membranes are moist.     Pharynx: Oropharynx is clear. No oropharyngeal exudate or posterior oropharyngeal erythema.  Eyes:     General: No scleral icterus.       Right eye: No discharge.        Left eye: No discharge.     Extraocular Movements: Extraocular movements intact.     Conjunctiva/sclera: Conjunctivae normal.  Cardiovascular:     Rate and Rhythm: Normal rate and regular rhythm.     Pulses: Normal pulses.     Heart sounds: Normal heart sounds. No murmur heard.    No friction rub. No gallop.  Pulmonary:     Effort: Pulmonary effort is normal. No respiratory distress.     Breath sounds: Normal breath sounds. No stridor. No wheezing, rhonchi or rales.  Chest:     Chest wall: No tenderness.  Abdominal:     General: There is no distension.     Palpations: Abdomen is soft.     Tenderness: There is no abdominal tenderness. There is no right CVA tenderness, left CVA tenderness or guarding.  Musculoskeletal:        General: No swelling, tenderness, deformity or signs of injury.     Right lower leg: No edema.     Left lower leg: No edema.  Skin:    General: Skin is warm and dry.     Capillary Refill: Capillary refill takes less than 2 seconds.     Coloration: Skin is not jaundiced or pale.     Findings: No bruising, erythema or lesion.  Neurological:     Mental Status: She is alert and oriented to person, place, and time.      Motor: No weakness.     Gait: Gait normal.  Psychiatric:        Mood and Affect: Mood normal.        Behavior: Behavior normal.        Thought Content: Thought content normal.        Judgment: Judgment normal.     BP 124/64 (Cuff Size: Small)   Pulse 76   Wt 177 lb (80.3 kg)   SpO2 98%   BMI 30.38 kg/m  Wt Readings from Last 3 Encounters:  01/26/24 177 lb (80.3 kg)  01/12/24 180 lb (81.6 kg)  12/17/23 179 lb (81.2 kg)    Lab Results  Component Value Date   TSH 1.750 08/14/2017   Lab Results  Component Value Date   WBC 6.6 04/23/2023   HGB 12.3 04/23/2023   HCT 38.4 04/23/2023   MCV 83 04/23/2023   PLT 304 04/23/2023   Lab Results  Component Value Date   NA 142 01/26/2024   K 3.8 01/26/2024   CO2 25 01/26/2024   GLUCOSE 85 01/26/2024   BUN 11 01/26/2024   CREATININE 1.17 (H) 01/26/2024   BILITOT <0.2 04/23/2023   ALKPHOS 77 04/23/2023   AST 10 04/23/2023   ALT 14 04/23/2023   PROT 7.2 04/23/2023   ALBUMIN 4.2 04/23/2023   CALCIUM  10.9 (H) 01/26/2024   EGFR 53 (L) 01/26/2024   Lab Results  Component Value Date   CHOL 143 01/26/2024   Lab Results  Component Value Date   HDL 62 01/26/2024   Lab Results  Component Value Date   LDLCALC 65 01/26/2024   Lab Results  Component Value Date   TRIG 87 01/26/2024   Lab Results  Component Value Date   CHOLHDL 2.3 01/26/2024  Lab Results  Component Value Date   HGBA1C 5.9 (A) 01/26/2024      Assessment & Plan:   Problem List Items Addressed This Visit       Cardiovascular and Mediastinum   Essential hypertension   BP Readings from Last 3 Encounters:  01/26/24 124/64  01/12/24 130/63  12/17/23 122/69  Addendum  Well-controlled with a blood pressure reading of 124/64 mmHg. - hydrochlorothiazide  25 mg daily discontinued due to hypercalcemia, starting valsartan  80 mg daily this will also help with kidney protection  - Encouraged regular exercise, such as fast walking for 30 minutes five days a  week. - Encouraged a heart-healthy, low salt, low fat diet. BMP in 2 weeks follow-up in 4 weeks      Relevant Medications   atorvastatin  (LIPITOR) 80 MG tablet   Other Relevant Orders   Basic Metabolic Panel (Completed)     Respiratory   Allergic rhinitis    Managed with  montelukast . Recent onset of rhinorrhea likely due to a viral upper respiratory infection. Has not been using floanse nasal spray, advised Flonase  nasal spary 2 spray into both nostrils daily  - Continue montelukast  10 mg oral daily at bedtime.         Endocrine   Type 2 diabetes mellitus with hyperglycemia, without long-term current use of insulin (HCC) - Primary    Well-controlled with an A1c of 5.9%. - Continue Ozempic  0.25 mg once a week. - Encouraged a low carbohydrate diet.      Relevant Medications   atorvastatin  (LIPITOR) 80 MG tablet   OZEMPIC , 0.25 OR 0.5 MG/DOSE, 2 MG/3ML SOPN   Other Relevant Orders   POCT glycosylated hemoglobin (Hb A1C) (Completed)   Lipid panel (Completed)     Other   Hyperlipidemia LDL goal <70   Lab Results  Component Value Date   CHOL 163 07/25/2023   HDL 61 07/25/2023   LDLCALC 85 07/25/2023   TRIG 91 07/25/2023   CHOLHDL 2.7 07/25/2023    Managed with atorvastatin  80 mg daily. Previous lipid panel showed elevated LDL cholesterol, prompting the increase in atorvastatin  dosage. - Ordered lipid panel to assess current cholesterol levels.  .      Relevant Medications   atorvastatin  (LIPITOR) 80 MG tablet   Health care maintenance   General Health Maintenance Discussed the importance of vaccinations, including pneumonia, shingles, and tetanus vaccines, especially for individuals over 4 years old. - Encouraged consideration of pneumonia, shingles, and tetanus vaccines       Meds ordered this encounter  Medications   atorvastatin  (LIPITOR) 80 MG tablet    Sig: Take 1 tablet (80 mg total) by mouth daily.    Dispense:  90 tablet    Refill:  2   DISCONTD:  hydrochlorothiazide  (HYDRODIURIL ) 25 MG tablet    Sig: Take 1 tablet (25 mg total) by mouth daily.    Dispense:  90 tablet    Refill:  1   OZEMPIC , 0.25 OR 0.5 MG/DOSE, 2 MG/3ML SOPN    Sig: Inject 0.25 mg into the skin once a week.    Dispense:  3 mL    Refill:  3    Follow-up: Return in about 4 months (around 05/26/2024).    Rekisha Welling R Raymond Azure, FNP     [1]  Allergies Allergen Reactions   Gramineae Pollens Cough   "

## 2024-01-26 NOTE — Assessment & Plan Note (Signed)
 Lab Results  Component Value Date   CHOL 163 07/25/2023   HDL 61 07/25/2023   LDLCALC 85 07/25/2023   TRIG 91 07/25/2023   CHOLHDL 2.7 07/25/2023    Managed with atorvastatin  80 mg daily. Previous lipid panel showed elevated LDL cholesterol, prompting the increase in atorvastatin  dosage. - Ordered lipid panel to assess current cholesterol levels.  SABRA

## 2024-01-26 NOTE — Patient Instructions (Signed)

## 2024-01-26 NOTE — Assessment & Plan Note (Signed)
 General Health Maintenance Discussed the importance of vaccinations, including pneumonia, shingles, and tetanus vaccines, especially for individuals over 61 years old. - Encouraged consideration of pneumonia, shingles, and tetanus vaccines

## 2024-01-26 NOTE — Assessment & Plan Note (Signed)
" °  Well-controlled with an A1c of 5.9%. - Continue Ozempic  0.25 mg once a week. - Encouraged a low carbohydrate diet. "

## 2024-01-27 ENCOUNTER — Ambulatory Visit: Payer: Self-pay | Admitting: Nurse Practitioner

## 2024-01-27 DIAGNOSIS — I10 Essential (primary) hypertension: Secondary | ICD-10-CM

## 2024-01-27 LAB — LIPID PANEL
Chol/HDL Ratio: 2.3 ratio (ref 0.0–4.4)
Cholesterol, Total: 143 mg/dL (ref 100–199)
HDL: 62 mg/dL
LDL Chol Calc (NIH): 65 mg/dL (ref 0–99)
Triglycerides: 87 mg/dL (ref 0–149)
VLDL Cholesterol Cal: 16 mg/dL (ref 5–40)

## 2024-01-27 LAB — BASIC METABOLIC PANEL WITH GFR
BUN/Creatinine Ratio: 9 — ABNORMAL LOW (ref 12–28)
BUN: 11 mg/dL (ref 8–27)
CO2: 25 mmol/L (ref 20–29)
Calcium: 10.9 mg/dL — ABNORMAL HIGH (ref 8.7–10.3)
Chloride: 103 mmol/L (ref 96–106)
Creatinine, Ser: 1.17 mg/dL — ABNORMAL HIGH (ref 0.57–1.00)
Glucose: 85 mg/dL (ref 70–99)
Potassium: 3.8 mmol/L (ref 3.5–5.2)
Sodium: 142 mmol/L (ref 134–144)
eGFR: 53 mL/min/1.73 — ABNORMAL LOW

## 2024-01-27 MED ORDER — VALSARTAN 80 MG PO TABS: 80.0000 mg | ORAL_TABLET | Freq: Every day | ORAL | 0 refills | Status: DC

## 2024-01-27 NOTE — Addendum Note (Signed)
 Addended by: JUANICE THOMES SAUNDERS on: 01/27/2024 08:47 AM   Modules accepted: Orders

## 2024-02-03 ENCOUNTER — Other Ambulatory Visit: Payer: Self-pay | Admitting: Nurse Practitioner

## 2024-02-03 DIAGNOSIS — E785 Hyperlipidemia, unspecified: Secondary | ICD-10-CM

## 2024-02-03 NOTE — Telephone Encounter (Signed)
 EQ ASPIRIN  ADULT LOW DOSE 81 MG tablet [Pharmacy Med Name: EQ Aspirin  Adult Low Dose 81 MG Oral Tablet Delayed Release]

## 2024-02-09 ENCOUNTER — Other Ambulatory Visit: Payer: Self-pay

## 2024-02-09 DIAGNOSIS — I10 Essential (primary) hypertension: Secondary | ICD-10-CM

## 2024-02-10 ENCOUNTER — Ambulatory Visit: Payer: Self-pay | Admitting: Nurse Practitioner

## 2024-02-10 LAB — BASIC METABOLIC PANEL WITH GFR
BUN/Creatinine Ratio: 11 — ABNORMAL LOW (ref 12–28)
BUN: 13 mg/dL (ref 8–27)
CO2: 22 mmol/L (ref 20–29)
Calcium: 10.5 mg/dL — ABNORMAL HIGH (ref 8.7–10.3)
Chloride: 105 mmol/L (ref 96–106)
Creatinine, Ser: 1.22 mg/dL — ABNORMAL HIGH (ref 0.57–1.00)
Glucose: 106 mg/dL — ABNORMAL HIGH (ref 70–99)
Potassium: 4.4 mmol/L (ref 3.5–5.2)
Sodium: 140 mmol/L (ref 134–144)
eGFR: 50 mL/min/1.73 — ABNORMAL LOW

## 2024-02-11 ENCOUNTER — Other Ambulatory Visit: Payer: Self-pay

## 2024-02-17 ENCOUNTER — Ambulatory Visit: Payer: Self-pay | Admitting: Nurse Practitioner

## 2024-02-17 ENCOUNTER — Encounter: Payer: Self-pay | Admitting: Nurse Practitioner

## 2024-02-17 VITALS — BP 105/57 | HR 62 | Wt 181.0 lb

## 2024-02-17 DIAGNOSIS — E785 Hyperlipidemia, unspecified: Secondary | ICD-10-CM

## 2024-02-17 DIAGNOSIS — E210 Primary hyperparathyroidism: Secondary | ICD-10-CM | POA: Diagnosis not present

## 2024-02-17 DIAGNOSIS — I10 Essential (primary) hypertension: Secondary | ICD-10-CM

## 2024-02-17 DIAGNOSIS — E1165 Type 2 diabetes mellitus with hyperglycemia: Secondary | ICD-10-CM | POA: Diagnosis not present

## 2024-02-17 DIAGNOSIS — N1831 Chronic kidney disease, stage 3a: Secondary | ICD-10-CM | POA: Diagnosis not present

## 2024-02-17 MED ORDER — VALSARTAN 80 MG PO TABS: 80.0000 mg | ORAL_TABLET | Freq: Every day | ORAL | 1 refills | Status: AC

## 2024-02-17 NOTE — Progress Notes (Signed)
 "  Established Patient Office Visit  Subjective:  Patient ID: Brenda Francis, female    DOB: 01-Apr-1960  Age: 64 y.o. MRN: [id number]  CC:  Chief Complaint  Patient presents with   Hypertension    HPI   Discussed the use of AI scribe software for clinical note transcription with the patient, who gave verbal consent to proceed.  History of Present Illness Brenda Francis is a 64 year old female  has a past medical history of Eczema (06/21/2019), Essential hypertension, Tobacco dependence due to cigarettes, and Type 2 diabetes mellitus with hyperglycemia, without long-term current use of insulin (HCC) (12/08/2019). with hyperparathyroidism  who presents for a follow-up visit.  She has a history of hyperparathyroidism, which has been associated with elevated calcium  levels. Her calcium  level was previously 10.9 and has improved to 10.5 after discontinuing hydrochlorothiazide  and starting valsartan  for hypertension. She is under the care of an endocrinologist for this condition.  She is currently on atorvastatin  80 mg, which was increased from 40 mg prior to her last visit. Her LDL cholesterol was last recorded at 65, which is within the target range for patients with diabetes. She is also on Ozempic .  She has a history of anemia in her youth, but recent blood work over the past 14 years has shown normal results. Her daughter has sickle cell disease, which she attributes to both her and her daughter's father having the sickle cell trait.  She avoids NSAIDs like Aleve  and ibuprofen  due to potential kidney issues and instead uses Tylenol for pain management. She takes her medications as prescribed and maintains hydration with at least 64 ounces of water daily.  Her blood pressure at home is monitored by her daughter, and she reports a recent reading of 119/50. She has stopped taking hydrochlorothiazide  and is now on valsartan .    Assessment & Plan     Past Medical History:  Diagnosis Date    Eczema 06/21/2019   Essential hypertension    Tobacco dependence due to cigarettes    Type 2 diabetes mellitus with hyperglycemia, without long-term current use of insulin (HCC) 12/08/2019    History reviewed. No pertinent surgical history.  Family History  Problem Relation Age of Onset   Lung cancer Mother    Diabetes Father    Anuerysm Brother    Sickle cell anemia Daughter    Stroke Maternal Grandmother     Social History   Socioeconomic History   Marital status: Single    Spouse name: Not on file   Number of children: 1   Years of education: Not on file   Highest education level: Not on file  Occupational History   Not on file  Tobacco Use   Smoking status: Former    Current packs/day: 0.25    Types: Cigarettes   Smokeless tobacco: Never  Vaping Use   Vaping status: Never Used  Substance and Sexual Activity   Alcohol use: Not Currently    Comment: moderation    Drug use: Never   Sexual activity: Not Currently  Other Topics Concern   Not on file  Social History Narrative   Lives with her daughter   Social Drivers of Health   Tobacco Use: Medium Risk (02/17/2024)   Patient History    Smoking Tobacco Use: Former    Smokeless Tobacco Use: Never    Passive Exposure: Not on Actuary Strain: Not on file  Food Insecurity: No Food Insecurity (02/17/2024)   Epic  Worried About Programme Researcher, Broadcasting/film/video in the Last Year: Never true    [employer] of Food in the Last Year: Never true  Recent Concern: Food Insecurity - Food Insecurity Present (01/12/2024)   Epic    Worried About Programme Researcher, Broadcasting/film/video in the Last Year: Never true    Ran Out of Food in the Last Year: Sometimes true  Transportation Needs: No Transportation Needs (02/17/2024)   Epic    Lack of Transportation (Medical): No    Lack of Transportation (Non-Medical): No  Physical Activity: Not on file  Stress: Not on file  Social Connections: Not on file  Intimate Partner Violence: Not At Risk  (02/17/2024)   Epic    Fear of Current or Ex-Partner: No    Emotionally Abused: No    Physically Abused: No    Sexually Abused: No  Depression (PHQ2-9): Low Risk (02/17/2024)   Depression (PHQ2-9)    PHQ-2 Score: 0  Alcohol Screen: Not on file  Housing: Low Risk (02/17/2024)   Epic    Unable to Pay for Housing in the Last Year: No    Number of Times Moved in the Last Year: 0    Homeless in the Last Year: No  Utilities: Not At Risk (02/17/2024)   Epic    Threatened with loss of utilities: No  Health Literacy: Not on file    Outpatient Medications Prior to Visit  Medication Sig Dispense Refill   atorvastatin  (LIPITOR) 80 MG tablet Take 1 tablet (80 mg total) by mouth daily. 90 tablet 2   Blood Glucose Monitoring Suppl (ACCU-CHEK GUIDE) w/Device KIT USE AS DIRECTED TO CHECK GLUCOSE IN THE MORNING AND AT NOON AND AT BEDTIME 1 kit 0   carbamide peroxide (DEBROX) 6.5 % OTIC solution Place 5 drops into both ears 2 (two) times daily. 15 mL 0   EQ ASPIRIN  ADULT LOW DOSE 81 MG tablet TAKE 1 TABLET BY MOUTH ONCE DAILY. SWALLOW  WHOLE. 90 tablet 0   fluocinonide  cream (LIDEX ) 0.05 % APPLY  CREAM EXTERNALLY TO AFFECTED AREA TWICE DAILY AS NEEDED 60 g 12   fluticasone  (FLONASE ) 50 MCG/ACT nasal spray Place 2 sprays into both nostrils daily. 16 g 6   montelukast  (SINGULAIR ) 10 MG tablet Take 1 tablet (10 mg total) by mouth at bedtime. 30 tablet 3   ondansetron  (ZOFRAN ) 4 MG tablet Take 1 tablet (4 mg total) by mouth every 8 (eight) hours as needed for nausea or vomiting. 20 tablet 0   OZEMPIC , 0.25 OR 0.5 MG/DOSE, 2 MG/3ML SOPN Inject 0.25 mg into the skin once a week. 3 mL 3   sodium chloride (OCEAN) 0.65 % SOLN nasal spray Place 1 spray into both nostrils as needed. 30 mL 0   valsartan  (DIOVAN ) 80 MG tablet Take 1 tablet (80 mg total) by mouth daily. 90 tablet 0   ibuprofen  (ADVIL ) 600 MG tablet TAKE 1 TABLET BY MOUTH EVERY 8 HOURS AS NEEDED (Patient not taking: Reported on 02/17/2024) 30 tablet 0    No facility-administered medications prior to visit.    Allergies[1]  ROS Review of Systems  Constitutional:  Negative for appetite change, chills, fatigue and fever.  HENT:  Negative for congestion, postnasal drip, rhinorrhea and sneezing.   Respiratory:  Negative for cough, shortness of breath and wheezing.   Cardiovascular:  Negative for chest pain, palpitations and leg swelling.  Gastrointestinal:  Negative for abdominal pain, constipation, nausea and vomiting.  Genitourinary:  Negative for difficulty urinating, dysuria, flank  pain and frequency.  Musculoskeletal:  Negative for arthralgias, back pain, joint swelling and myalgias.  Skin:  Negative for color change, pallor, rash and wound.  Neurological:  Negative for dizziness, facial asymmetry, weakness, numbness and headaches.  Psychiatric/Behavioral:  Negative for behavioral problems, confusion, self-injury and suicidal ideas.       Objective:    Physical Exam Vitals and nursing note reviewed.  Constitutional:      General: She is not in acute distress.    Appearance: Normal appearance. She is obese. She is not ill-appearing, toxic-appearing or diaphoretic.  HENT:     Mouth/Throat:     Mouth: Mucous membranes are moist.     Pharynx: Oropharynx is clear. No oropharyngeal exudate or posterior oropharyngeal erythema.  Eyes:     General: No scleral icterus.       Right eye: No discharge.        Left eye: No discharge.     Extraocular Movements: Extraocular movements intact.     Conjunctiva/sclera: Conjunctivae normal.  Cardiovascular:     Rate and Rhythm: Normal rate and regular rhythm.     Pulses: Normal pulses.     Heart sounds: Normal heart sounds. No murmur heard.    No friction rub. No gallop.  Pulmonary:     Effort: Pulmonary effort is normal. No respiratory distress.     Breath sounds: Normal breath sounds. No stridor. No wheezing, rhonchi or rales.  Chest:     Chest wall: No tenderness.  Abdominal:      General: There is no distension.     Palpations: Abdomen is soft.     Tenderness: There is no abdominal tenderness. There is no right CVA tenderness, left CVA tenderness or guarding.  Musculoskeletal:        General: No swelling, tenderness, deformity or signs of injury.     Right lower leg: No edema.     Left lower leg: No edema.  Skin:    General: Skin is warm and dry.     Capillary Refill: Capillary refill takes less than 2 seconds.     Coloration: Skin is not jaundiced or pale.     Findings: No bruising, erythema or lesion.  Neurological:     Mental Status: She is alert and oriented to person, place, and time.     Motor: No weakness.     Coordination: Coordination normal.     Gait: Gait normal.  Psychiatric:        Mood and Affect: Mood normal.        Behavior: Behavior normal.        Thought Content: Thought content normal.        Judgment: Judgment normal.     BP (!) 105/57   Pulse 62   Wt 181 lb (82.1 kg)   SpO2 100%   BMI 31.07 kg/m  Wt Readings from Last 3 Encounters:  02/17/24 181 lb (82.1 kg)  01/26/24 177 lb (80.3 kg)  01/12/24 180 lb (81.6 kg)    Lab Results  Component Value Date   TSH 1.750 08/14/2017   Lab Results  Component Value Date   WBC 6.6 04/23/2023   HGB 12.3 04/23/2023   HCT 38.4 04/23/2023   MCV 83 04/23/2023   PLT 304 04/23/2023   Lab Results  Component Value Date   NA 140 02/09/2024   K 4.4 02/09/2024   CO2 22 02/09/2024   GLUCOSE 106 (H) 02/09/2024   BUN 13 02/09/2024   CREATININE 1.22 (  H) 02/09/2024   BILITOT <0.2 04/23/2023   ALKPHOS 77 04/23/2023   AST 10 04/23/2023   ALT 14 04/23/2023   PROT 7.2 04/23/2023   ALBUMIN 4.2 04/23/2023   CALCIUM  10.5 (H) 02/09/2024   EGFR 50 (L) 02/09/2024   Lab Results  Component Value Date   CHOL 143 01/26/2024   Lab Results  Component Value Date   HDL 62 01/26/2024   Lab Results  Component Value Date   LDLCALC 65 01/26/2024   Lab Results  Component Value Date   TRIG 87  01/26/2024   Lab Results  Component Value Date   CHOLHDL 2.3 01/26/2024   Lab Results  Component Value Date   HGBA1C 5.9 (A) 01/26/2024      Assessment & Plan:   Problem List Items Addressed This Visit       Cardiovascular and Mediastinum   Essential hypertension - Primary   BP Readings from Last 3 Encounters:  02/17/24 (!) 105/57  01/26/24 124/64  01/12/24 130/63   Blood pressure well-controlled with valsartan  80 mg daily. Recent switch from hydrochlorothiazide  due to elevated calcium  levels and renal protection. - Continue valsartan  for blood pressure management. Discussed DASH diet and dietary sodium restrictions Continue to increase dietary efforts and exercise.          Relevant Medications   valsartan  (DIOVAN ) 80 MG tablet     Endocrine   Type 2 diabetes mellitus with hyperglycemia, without long-term current use of insulin (HCC)   Lab Results  Component Value Date   HGBA1C 5.9 (A) 01/26/2024    Type 2 diabetes mellitus Well-controlled with current management.  Continue Ozempic  0.25 mg weekly        Relevant Medications   valsartan  (DIOVAN ) 80 MG tablet   Primary hyperparathyroidism    Managed by endocrinologist. Recent labs show improvement in calcium  levels.         Genitourinary   CKD stage 3a, GFR 45-59 ml/min [facility])   Lab Results  Component Value Date   NA 140 02/09/2024   K 4.4 02/09/2024   CO2 22 02/09/2024   GLUCOSE 106 (H) 02/09/2024   BUN 13 02/09/2024   CREATININE 1.22 (H) 02/09/2024   CALCIUM  10.5 (H) 02/09/2024   EGFR 50 (L) 02/09/2024   GFRNONAA 70 12/07/2019  Avoid NSAIDs and other nephrotoxic agents Drink at least 64 ounces of water daily to maintain hydration Continue valsartan  80 mg daily        Other   Hyperlipidemia LDL goal <70   Controlled on atorvastatin  80 mg daily      Relevant Medications   valsartan  (DIOVAN ) 80 MG tablet    Meds ordered this encounter  Medications   valsartan  (DIOVAN ) 80 MG tablet     Sig: Take 1 tablet (80 mg total) by mouth daily.    Dispense:  90 tablet    Refill:  1    Hydrochlorothiazide  discontinued.    Follow-up: Return in about 4 months (around 06/16/2024) for HTN, DM.    Maelee Hoot R Adalai Perl, FNP    [1]  Allergies Allergen Reactions   Gramineae Pollens Cough   "

## 2024-02-17 NOTE — Assessment & Plan Note (Signed)
 Lab Results  Component Value Date   NA 140 02/09/2024   K 4.4 02/09/2024   CO2 22 02/09/2024   GLUCOSE 106 (H) 02/09/2024   BUN 13 02/09/2024   CREATININE 1.22 (H) 02/09/2024   CALCIUM  10.5 (H) 02/09/2024   EGFR 50 (L) 02/09/2024   GFRNONAA 70 12/07/2019  Avoid NSAIDs and other nephrotoxic agents Drink at least 64 ounces of water daily to maintain hydration Continue valsartan  80 mg daily

## 2024-02-17 NOTE — Patient Instructions (Signed)

## 2024-02-17 NOTE — Assessment & Plan Note (Signed)
 Lab Results  Component Value Date   HGBA1C 5.9 (A) 01/26/2024    Type 2 diabetes mellitus Well-controlled with current management.  Continue Ozempic  0.25 mg weekly

## 2024-02-17 NOTE — Assessment & Plan Note (Signed)
 BP Readings from Last 3 Encounters:  02/17/24 (!) 105/57  01/26/24 124/64  01/12/24 130/63   Blood pressure well-controlled with valsartan  80 mg daily. Recent switch from hydrochlorothiazide  due to elevated calcium  levels and renal protection. - Continue valsartan  for blood pressure management. Discussed DASH diet and dietary sodium restrictions Continue to increase dietary efforts and exercise.

## 2024-02-17 NOTE — Assessment & Plan Note (Signed)
" °  Managed by endocrinologist. Recent labs show improvement in calcium  levels.  "

## 2024-02-17 NOTE — Assessment & Plan Note (Signed)
 Controlled on atorvastatin 80 mg daily

## 2024-03-08 ENCOUNTER — Ambulatory Visit: Payer: Self-pay | Admitting: Nurse Practitioner

## 2024-04-28 ENCOUNTER — Other Ambulatory Visit

## 2024-05-03 ENCOUNTER — Other Ambulatory Visit

## 2024-05-03 ENCOUNTER — Ambulatory Visit: Admitting: "Endocrinology

## 2024-05-26 ENCOUNTER — Ambulatory Visit: Payer: Self-pay | Admitting: Nurse Practitioner

## 2024-06-16 ENCOUNTER — Ambulatory Visit: Payer: Self-pay | Admitting: Nurse Practitioner
